# Patient Record
Sex: Female | Born: 1970 | Race: White | Hispanic: No | Marital: Single | Smoking: Current every day smoker
Health system: Southern US, Community
[De-identification: ages and names within clinical notes are randomized; demographics above are authoritative.]

## PROBLEM LIST (undated history)

## (undated) DIAGNOSIS — N200 Calculus of kidney: Secondary | ICD-10-CM

## (undated) DIAGNOSIS — G8929 Other chronic pain: Secondary | ICD-10-CM

## (undated) DIAGNOSIS — T4145XA Adverse effect of unspecified anesthetic, initial encounter: Secondary | ICD-10-CM

## (undated) DIAGNOSIS — T8859XA Other complications of anesthesia, initial encounter: Secondary | ICD-10-CM

## (undated) DIAGNOSIS — M25511 Pain in right shoulder: Secondary | ICD-10-CM

## (undated) HISTORY — PX: FRACTURE SURGERY: SHX138

## (undated) HISTORY — PX: ANKLE FRACTURE SURGERY: SHX122

## (undated) HISTORY — PX: TUBAL LIGATION: SHX77

---

## 2003-05-14 ENCOUNTER — Encounter: Payer: Self-pay | Admitting: Emergency Medicine

## 2003-05-14 ENCOUNTER — Emergency Department (HOSPITAL_COMMUNITY): Admission: EM | Admit: 2003-05-14 | Discharge: 2003-05-14 | Payer: Self-pay | Admitting: Emergency Medicine

## 2003-05-24 ENCOUNTER — Emergency Department (HOSPITAL_COMMUNITY): Admission: EM | Admit: 2003-05-24 | Discharge: 2003-05-24 | Payer: Self-pay | Admitting: Emergency Medicine

## 2003-05-26 ENCOUNTER — Emergency Department (HOSPITAL_COMMUNITY): Admission: EM | Admit: 2003-05-26 | Discharge: 2003-05-26 | Payer: Self-pay | Admitting: *Deleted

## 2003-05-26 ENCOUNTER — Encounter: Payer: Self-pay | Admitting: *Deleted

## 2003-08-28 ENCOUNTER — Encounter: Payer: Self-pay | Admitting: Emergency Medicine

## 2003-08-28 ENCOUNTER — Emergency Department (HOSPITAL_COMMUNITY): Admission: EM | Admit: 2003-08-28 | Discharge: 2003-08-28 | Payer: Self-pay | Admitting: Emergency Medicine

## 2005-09-28 ENCOUNTER — Emergency Department (HOSPITAL_COMMUNITY): Admission: EM | Admit: 2005-09-28 | Discharge: 2005-09-28 | Payer: Self-pay | Admitting: Emergency Medicine

## 2005-10-07 ENCOUNTER — Emergency Department (HOSPITAL_COMMUNITY): Admission: EM | Admit: 2005-10-07 | Discharge: 2005-10-07 | Payer: Self-pay | Admitting: Emergency Medicine

## 2006-08-06 ENCOUNTER — Emergency Department (HOSPITAL_COMMUNITY): Admission: EM | Admit: 2006-08-06 | Discharge: 2006-08-06 | Payer: Self-pay | Admitting: Emergency Medicine

## 2006-08-10 ENCOUNTER — Emergency Department (HOSPITAL_COMMUNITY): Admission: EM | Admit: 2006-08-10 | Discharge: 2006-08-10 | Payer: Self-pay | Admitting: Emergency Medicine

## 2006-08-12 ENCOUNTER — Emergency Department (HOSPITAL_COMMUNITY): Admission: EM | Admit: 2006-08-12 | Discharge: 2006-08-12 | Payer: Self-pay | Admitting: Emergency Medicine

## 2006-08-27 ENCOUNTER — Emergency Department (HOSPITAL_COMMUNITY): Admission: EM | Admit: 2006-08-27 | Discharge: 2006-08-27 | Payer: Self-pay | Admitting: Emergency Medicine

## 2007-05-03 ENCOUNTER — Emergency Department (HOSPITAL_COMMUNITY): Admission: EM | Admit: 2007-05-03 | Discharge: 2007-05-03 | Payer: Self-pay | Admitting: Emergency Medicine

## 2013-03-22 ENCOUNTER — Encounter (HOSPITAL_COMMUNITY): Payer: Self-pay | Admitting: *Deleted

## 2013-03-22 ENCOUNTER — Emergency Department (HOSPITAL_COMMUNITY)
Admission: EM | Admit: 2013-03-22 | Discharge: 2013-03-22 | Disposition: A | Discharge status: Home / Self Care | Payer: Self-pay | Attending: Emergency Medicine | Admitting: Emergency Medicine

## 2013-03-22 DIAGNOSIS — G8929 Other chronic pain: Secondary | ICD-10-CM | POA: Insufficient documentation

## 2013-03-22 DIAGNOSIS — M25511 Pain in right shoulder: Secondary | ICD-10-CM

## 2013-03-22 DIAGNOSIS — Z87828 Personal history of other (healed) physical injury and trauma: Secondary | ICD-10-CM | POA: Insufficient documentation

## 2013-03-22 DIAGNOSIS — M25519 Pain in unspecified shoulder: Secondary | ICD-10-CM | POA: Insufficient documentation

## 2013-03-22 DIAGNOSIS — F17200 Nicotine dependence, unspecified, uncomplicated: Secondary | ICD-10-CM | POA: Insufficient documentation

## 2013-03-22 MED ORDER — HYDROCODONE-ACETAMINOPHEN 5-325 MG PO TABS
1.0000 | ORAL_TABLET | Freq: Once | ORAL | Status: AC
  Administered 2013-03-22: 1 via ORAL
  Filled 2013-03-22: qty 1

## 2013-03-22 MED ORDER — CYCLOBENZAPRINE HCL 10 MG PO TABS: 10.0000 mg | ORAL_TABLET | Freq: Two times a day (BID) | ORAL | Status: DC | PRN

## 2013-03-22 MED ORDER — CYCLOBENZAPRINE HCL 10 MG PO TABS
10.0000 mg | ORAL_TABLET | Freq: Once | ORAL | Status: AC
  Administered 2013-03-22: 10 mg via ORAL
  Filled 2013-03-22: qty 1

## 2013-03-22 MED ORDER — IBUPROFEN 800 MG PO TABS
800.0000 mg | ORAL_TABLET | Freq: Once | ORAL | Status: AC
  Administered 2013-03-22: 800 mg via ORAL
  Filled 2013-03-22: qty 1

## 2013-03-22 MED ORDER — IBUPROFEN 800 MG PO TABS: 800.0000 mg | ORAL_TABLET | Freq: Three times a day (TID) | ORAL | Status: DC

## 2013-03-22 NOTE — ED Provider Notes (Signed)
History     CSN: [id number]  Arrival date & time 03/22/13  0112   First MD Initiated Contact with Patient 03/22/13 [phone]      Chief Complaint  Patient presents with  . Shoulder Pain    rt shoulder    (Consider location/radiation/quality/duration/timing/severity/associated sxs/prior treatment) HPI History provided by patient. Chronic right shoulder pain, has history of rotator cuff injury and bursitis but has never had surgery. Earlier today she tried to start a pull start lawnmower with exacerbation of pain. No weakness or numbness. No neck pain. No fevers. No other pain or trauma. Symptoms moderate severity. She is unable to sleep tonight due to pain. She has not taken anything at home for this. Has history of the same History reviewed. No pertinent past medical history.  Past Surgical History  Procedure Laterality Date  . Tubal ligation      History reviewed. No pertinent family history.  History  Substance Use Topics  . Smoking status: Heavy Tobacco Smoker  . Smokeless tobacco: Not on file  . Alcohol Use: Yes    OB History   Grav Para Term Preterm Abortions TAB SAB Ect Mult Living                  Review of Systems  Constitutional: Negative for fever and chills.  HENT: Negative for neck pain and neck stiffness.   Eyes: Negative for pain.  Respiratory: Negative for shortness of breath.   Cardiovascular: Negative for chest pain.  Gastrointestinal: Negative for abdominal pain.  Genitourinary: Negative for dysuria.  Musculoskeletal: Negative for back pain.  Skin: Negative for rash.  Neurological: Negative for headaches.  All other systems reviewed and are negative.    Allergies  Review of patient's allergies indicates no known allergies.  Home Medications  No current outpatient prescriptions on file.  BP 101/67  Pulse 88  Temp(Src) 98.1 F (36.7 C) (Oral)  Ht 5\' 7"  (1.702 m)  Wt 200 lb (90.719 kg)  BMI 31.32 kg/m2  SpO2 100%  LMP  02/20/2013  Physical Exam  Constitutional: She is oriented to person, place, and time. She appears well-developed and well-nourished.  HENT:  Head: Normocephalic and atraumatic.  Eyes: EOM are normal. Pupils are equal, round, and reactive to light.  Neck: Neck supple.  Cardiovascular: Regular rhythm and intact distal pulses.   Pulmonary/Chest: Effort normal. No respiratory distress.  Musculoskeletal: She exhibits no edema.  Right shoulder mild tenderness to palpation over the a.c. area without deformity. Reproducible pain with range of motion and testing against resistance. No swelling. No erythema. Distal neurovascular intact with no tenderness over clavicle, elbow or cervical spine  Neurological: She is alert and oriented to person, place, and time.  Skin: Skin is warm and dry.    ED Course  Procedures (including critical care time)  Right upper extremity sling. Motrin. Flexeril. Norco. Plan outpatient followup with orthopedic referral. Return precautions verbalized as understood  MDM  Right shoulder pain with known rotator cuff injury. No deformity or indication for x-rays at this time. Patient may benefit from MRI.   Sling And medications provided. Vital signs and nursing notes reviewed      Sunnie Nielsen, MD 03/22/13 [phone]

## 2013-03-22 NOTE — ED Notes (Signed)
Pt alert & oriented x4, stable gait. Patient given discharge instructions, paperwork & prescription(s). Patient  instructed to stop at the registration desk to finish any additional paperwork. Patient verbalized understanding. Pt left department w/ no further questions. 

## 2013-03-22 NOTE — ED Notes (Signed)
Pt has chronic pain in rt shoulder from rotator cuff injury, pt re injured shoulder on a pull start lawn mower.

## 2013-04-11 ENCOUNTER — Emergency Department (HOSPITAL_COMMUNITY)
Admission: EM | Admit: 2013-04-11 | Discharge: 2013-04-11 | Disposition: A | Discharge status: Home / Self Care | Payer: Self-pay | Attending: Emergency Medicine | Admitting: Emergency Medicine

## 2013-04-11 ENCOUNTER — Emergency Department (HOSPITAL_COMMUNITY): Payer: Self-pay

## 2013-04-11 ENCOUNTER — Encounter (HOSPITAL_COMMUNITY): Payer: Self-pay | Admitting: *Deleted

## 2013-04-11 DIAGNOSIS — F17200 Nicotine dependence, unspecified, uncomplicated: Secondary | ICD-10-CM | POA: Insufficient documentation

## 2013-04-11 DIAGNOSIS — Z9889 Other specified postprocedural states: Secondary | ICD-10-CM | POA: Insufficient documentation

## 2013-04-11 DIAGNOSIS — Y9301 Activity, walking, marching and hiking: Secondary | ICD-10-CM | POA: Insufficient documentation

## 2013-04-11 DIAGNOSIS — X500XXA Overexertion from strenuous movement or load, initial encounter: Secondary | ICD-10-CM | POA: Insufficient documentation

## 2013-04-11 DIAGNOSIS — Y9289 Other specified places as the place of occurrence of the external cause: Secondary | ICD-10-CM | POA: Insufficient documentation

## 2013-04-11 DIAGNOSIS — S93409A Sprain of unspecified ligament of unspecified ankle, initial encounter: Secondary | ICD-10-CM | POA: Insufficient documentation

## 2013-04-11 MED ORDER — HYDROCODONE-ACETAMINOPHEN 5-325 MG PO TABS
1.0000 | ORAL_TABLET | ORAL | Status: DC | PRN
Start: 2013-04-11 — End: 2013-04-19

## 2013-04-11 MED ORDER — HYDROCODONE-ACETAMINOPHEN 5-325 MG PO TABS
1.0000 | ORAL_TABLET | Freq: Once | ORAL | Status: AC
  Administered 2013-04-11: 1 via ORAL
  Filled 2013-04-11: qty 1

## 2013-04-11 NOTE — ED Notes (Signed)
Twisted lt ankle on Saturday, previous surgery for fracture 1991

## 2013-04-11 NOTE — ED Provider Notes (Signed)
History     CSN: [id number]  Arrival date & time 04/11/13  1129   First MD Initiated Contact with Patient 04/11/13 1222      Chief Complaint  Patient presents with  . Ankle Pain    (Consider location/radiation/quality/duration/timing/severity/associated sxs/prior treatment) HPI Comments: Elaine Morgan is a 42 y.o. Female presenting with left ankle pain after having an inversion injury 2 days ago while walking.  She has a previous left ankle fracture from over 20 years ago which required ORIF.  She is concerned for new injury at this site.  Her pain is constant, throbbing, and does not radiate.  She has tried elevation, rest and ibuprofen without relief of symptoms.  She denies numbness distal to the injury site.     The history is provided by the patient.    History reviewed. No pertinent past medical history.  Past Surgical History  Procedure Laterality Date  . Tubal ligation    . Fracture surgery    . Ankle fracture surgery      History reviewed. No pertinent family history.  History  Substance Use Topics  . Smoking status: Heavy Tobacco Smoker    Types: Cigarettes  . Smokeless tobacco: Not on file  . Alcohol Use: No    OB History   Grav Para Term Preterm Abortions TAB SAB Ect Mult Living                  Review of Systems  Musculoskeletal: Positive for joint swelling and arthralgias.  Skin: Negative for wound.  Neurological: Negative for weakness and numbness.    Allergies  Review of patient's allergies indicates no known allergies.  Home Medications   Current Outpatient Rx  Name  Route  Sig  Dispense  Refill  . ibuprofen (ADVIL,MOTRIN) 200 MG tablet   Oral   Take 400 mg by mouth every 6 (six) hours as needed for pain.         Marland Kitchen HYDROcodone-acetaminophen (NORCO/VICODIN) 5-325 MG per tablet   Oral   Take 1 tablet by mouth every 4 (four) hours as needed for pain.   15 tablet   0     BP 151/108  Pulse 72  Temp(Src) 97.6 F (36.4 C) (Oral)   Ht 5\' 7"  (1.702 m)  Wt 200 lb (90.719 kg)  BMI 31.32 kg/m2  SpO2 100%  LMP 03/23/2013  Physical Exam  Nursing note and vitals reviewed. Constitutional: She appears well-developed and well-nourished.  HENT:  Head: Normocephalic.  Neck: Normal range of motion.  Cardiovascular: Normal rate and intact distal pulses.  Exam reveals no decreased pulses.   Pulses:      Dorsalis pedis pulses are 2+ on the right side, and 2+ on the left side.       Posterior tibial pulses are 2+ on the right side, and 2+ on the left side.  Musculoskeletal: She exhibits edema and tenderness.       Left ankle: She exhibits swelling. She exhibits normal pulse. Tenderness. Medial malleolus tenderness found. No head of 5th metatarsal and no proximal fibula tenderness found. Achilles tendon normal.  Neurological: She is alert. No sensory deficit.  Skin: Skin is warm, dry and intact.    ED Course  Procedures (including critical care time)  Labs Reviewed - No data to display Dg Ankle Complete Left  04/11/2013  *RADIOLOGY REPORT*  Clinical Data: Ankle pain, twisting injury several days ago  LEFT ANKLE COMPLETE - 3+ VIEW  Comparison: None.  Findings: Mild  soft tissue swelling about the ankle.  No acute fracture or malalignment identified.  Surgical fixation of prior medial malleolus fracture with pain and cannulated lag screw without evidence of hardware complication.  A healed lateral malleolar fracture with a single inner fragmentary screw is also noted without evidence of complication.  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  The talar dome appears intact and normally mineralized.  Well corticated ossific fragments adjacent to the medial malleolus likely reflects sequelae of prior injury or surgery.  Ankle mortise is congruent.  IMPRESSION:  1.  No acute osseous abnormality. 2.  Surgical changes of ORIF of bimalleolar fracture without evidence of complication.   Original Report Authenticated By:  Malachy Moan, M.D.      1. Ankle sprain and strain, left, initial encounter       MDM  Patients labs and/or radiological studies were viewed and considered during the medical decision making and disposition process.  ASO and crutches provided.  Cap refill normal after ASO applied.  RICE, referral to ortho if pain symptoms and swelling are not better over the next 10days.            Burgess Amor, PA-C 04/12/13 [phone]

## 2013-04-12 NOTE — ED Provider Notes (Signed)
Medical screening examination/treatment/procedure(s) were performed by non-physician practitioner and as supervising physician I was immediately available for consultation/collaboration.   Dione Booze, MD 04/12/13 [phone]

## 2013-04-19 ENCOUNTER — Encounter (HOSPITAL_COMMUNITY): Payer: Self-pay

## 2013-04-19 ENCOUNTER — Emergency Department (HOSPITAL_COMMUNITY): Payer: Self-pay

## 2013-04-19 ENCOUNTER — Emergency Department (HOSPITAL_COMMUNITY)
Admission: EM | Admit: 2013-04-19 | Discharge: 2013-04-19 | Disposition: A | Discharge status: Home / Self Care | Payer: Self-pay | Attending: Emergency Medicine | Admitting: Emergency Medicine

## 2013-04-19 DIAGNOSIS — F17200 Nicotine dependence, unspecified, uncomplicated: Secondary | ICD-10-CM | POA: Insufficient documentation

## 2013-04-19 DIAGNOSIS — Z9851 Tubal ligation status: Secondary | ICD-10-CM | POA: Insufficient documentation

## 2013-04-19 DIAGNOSIS — R112 Nausea with vomiting, unspecified: Secondary | ICD-10-CM | POA: Insufficient documentation

## 2013-04-19 DIAGNOSIS — N201 Calculus of ureter: Secondary | ICD-10-CM | POA: Insufficient documentation

## 2013-04-19 DIAGNOSIS — Z3202 Encounter for pregnancy test, result negative: Secondary | ICD-10-CM | POA: Insufficient documentation

## 2013-04-19 LAB — CBC WITH DIFFERENTIAL/PLATELET
Basophils Absolute: 0 10*3/uL (ref 0.0–0.1)
Basophils Relative: 0 % (ref 0–1)
Eosinophils Absolute: 0.1 10*3/uL (ref 0.0–0.7)
Eosinophils Relative: 1 % (ref 0–5)
HCT: 41.5 % (ref 36.0–46.0)
Hemoglobin: 14.3 g/dL (ref 12.0–15.0)
Lymphocytes Relative: 20 % (ref 12–46)
MCHC: 34.5 g/dL (ref 30.0–36.0)
MCV: 94.5 fL (ref 78.0–100.0)
Monocytes Absolute: 0.7 10*3/uL (ref 0.1–1.0)
Monocytes Relative: 6 % (ref 3–12)
Neutro Abs: 8.2 10*3/uL — ABNORMAL HIGH (ref 1.7–7.7)
Platelets: 206 10*3/uL (ref 150–400)
RBC: 4.39 MIL/uL (ref 3.87–5.11)

## 2013-04-19 LAB — URINALYSIS, ROUTINE W REFLEX MICROSCOPIC
Glucose, UA: NEGATIVE mg/dL
Ketones, ur: NEGATIVE mg/dL
Protein, ur: 30 mg/dL — AB
Specific Gravity, Urine: 1.02 (ref 1.005–1.030)
Urobilinogen, UA: 0.2 mg/dL (ref 0.0–1.0)
pH: 7 (ref 5.0–8.0)

## 2013-04-19 LAB — COMPREHENSIVE METABOLIC PANEL
ALT: 9 U/L (ref 0–35)
AST: 17 U/L (ref 0–37)
Alkaline Phosphatase: 72 U/L (ref 39–117)
BUN: 12 mg/dL (ref 6–23)
CO2: 25 mEq/L (ref 19–32)
Calcium: 9.2 mg/dL (ref 8.4–10.5)
Chloride: 102 mEq/L (ref 96–112)
Creatinine, Ser: 0.99 mg/dL (ref 0.50–1.10)
GFR calc Af Amer: 80 mL/min — ABNORMAL LOW (ref >90)
GFR calc non Af Amer: 69 mL/min — ABNORMAL LOW (ref >90)
Glucose, Bld: 98 mg/dL (ref 70–99)
Potassium: 3.6 mEq/L (ref 3.5–5.1)
Sodium: 138 mEq/L (ref 135–145)
Total Bilirubin: 0.3 mg/dL (ref 0.3–1.2)
Total Protein: 7.4 g/dL (ref 6.0–8.3)

## 2013-04-19 LAB — URINE MICROSCOPIC-ADD ON

## 2013-04-19 LAB — PREGNANCY, URINE: Preg Test, Ur: NEGATIVE

## 2013-04-19 MED ORDER — ONDANSETRON HCL 4 MG/2ML IJ SOLN: 4.0000 mg | Freq: Once | INTRAMUSCULAR | Status: DC

## 2013-04-19 MED ORDER — HYDROMORPHONE HCL PF 1 MG/ML IJ SOLN
1.0000 mg | Freq: Once | INTRAMUSCULAR | Status: AC
  Administered 2013-04-19: 1 mg via INTRAVENOUS
  Filled 2013-04-19: qty 1

## 2013-04-19 MED ORDER — OXYCODONE-ACETAMINOPHEN 5-325 MG PO TABS
1.0000 | ORAL_TABLET | ORAL | Status: DC | PRN
Start: 2013-04-19 — End: 2013-05-19

## 2013-04-19 MED ORDER — IBUPROFEN 600 MG PO TABS: 600.0000 mg | ORAL_TABLET | Freq: Three times a day (TID) | ORAL | Status: DC | PRN

## 2013-04-19 MED ORDER — ONDANSETRON HCL 4 MG/2ML IJ SOLN
4.0000 mg | Freq: Once | INTRAMUSCULAR | Status: AC
  Administered 2013-04-19: 4 mg via INTRAVENOUS
  Filled 2013-04-19: qty 2

## 2013-04-19 MED ORDER — PROMETHAZINE HCL 25 MG PO TABS: 25.0000 mg | ORAL_TABLET | Freq: Four times a day (QID) | ORAL | Status: DC | PRN

## 2013-04-19 MED ORDER — KETOROLAC TROMETHAMINE 30 MG/ML IJ SOLN
30.0000 mg | Freq: Once | INTRAMUSCULAR | Status: AC
  Administered 2013-04-19: 30 mg via INTRAVENOUS
  Filled 2013-04-19: qty 1

## 2013-04-19 MED ORDER — TAMSULOSIN HCL 0.4 MG PO CAPS: 0.4000 mg | ORAL_CAPSULE | Freq: Every day | ORAL | Status: DC

## 2013-04-19 NOTE — ED Notes (Signed)
Per patient, she started having severe abdominal pain/cramping in her lower abd yesterday around 5pm. Since then she has vomited every 30 minutes. The pain radiates towards her left lower back. Denies pain during urination. Unable to keep food/drink down.

## 2013-04-19 NOTE — ED Notes (Signed)
Patient states that she started having lower abdominal cramping last night and it went away.  States that today, she woke up having lower abdominal pain with nausea and vomiting.  Rates her pain 10/10.  Reports that her period is supposed to start 04/22/2013, states that she did have a tubal ligation many years ago.

## 2013-04-19 NOTE — ED Provider Notes (Signed)
History    This chart was scribed for Lyanne Co, MD by Marlyne Beards, ED Scribe. The patient was seen in room APA03/[facility]03. Patient's care was started at 2:54 PM.    CSN: [id number]  Arrival date & time 04/19/13  1346   First MD Initiated Contact with Patient 04/19/13 1454      Chief Complaint  Patient presents with  . Abdominal Pain  . Emesis    (Consider location/radiation/quality/duration/timing/severity/associated sxs/prior treatment) HPI HPI Comments: Elaine Morgan is a 42 y.o. female who presents to the Emergency Department complaining of moderate constant lower abdominal pain with a cramping sensation which started earlier yesterday. Pt states the pain radiates from her left lower abdomen up into her back. She rates the pain a 10/10 as of now in the ED. Pt has associated nausea, vomiting, and is unable to keep food down. She reports she has an episode of emesis every 30 minutes. Pt denies any dysuria, fever, chills, cough, nausea, vomiting, diarrhea, SOB, weakness, and any other associated symptoms.   History reviewed. No pertinent past medical history.  Past Surgical History  Procedure Laterality Date  . Tubal ligation    . Fracture surgery    . Ankle fracture surgery      No family history on file.  History  Substance Use Topics  . Smoking status: Heavy Tobacco Smoker -- 1.00 packs/day    Types: Cigarettes  . Smokeless tobacco: Not on file  . Alcohol Use: No    OB History   Grav Para Term Preterm Abortions TAB SAB Ect Mult Living                  Review of Systems  Allergies  Review of patient's allergies indicates no known allergies.  Home Medications   Current Outpatient Rx  Name  Route  Sig  Dispense  Refill  . HYDROcodone-acetaminophen (NORCO/VICODIN) 5-325 MG per tablet   Oral   Take 1 tablet by mouth every 4 (four) hours as needed for pain.   15 tablet   0   . ibuprofen (ADVIL,MOTRIN) 200 MG tablet   Oral   Take 400 mg by mouth every 6  (six) hours as needed for pain.           BP 173/99  Pulse 58  Temp(Src) 97.2 F (36.2 C) (Oral)  Resp 16  Ht 5\' 7"  (1.702 m)  Wt 200 lb (90.719 kg)  BMI 31.32 kg/m2  SpO2 100%  LMP 03/23/2013  Physical Exam  Nursing note and vitals reviewed. Constitutional: She is oriented to person, place, and time. She appears well-developed and well-nourished. No distress.  HENT:  Head: Normocephalic and atraumatic.  Eyes: EOM are normal.  Neck: Normal range of motion.  Cardiovascular: Normal rate, regular rhythm and normal heart sounds.   Pulmonary/Chest: Effort normal and breath sounds normal.  Abdominal: Soft. She exhibits no distension. There is no tenderness. There is no rebound and no guarding.  No lower abdominal tenderness. No left CVA tenderness.  Musculoskeletal: Normal range of motion.  Neurological: She is alert and oriented to person, place, and time.  Skin: Skin is warm and dry.  Psychiatric: She has a normal mood and affect. Judgment normal.    ED Course  Procedures (including critical care time) DIAGNOSTIC STUDIES: Oxygen Saturation is 100% on room air, normal by my interpretation.    COORDINATION OF CARE: 3:09 PM Discussed ED treatment with pt and pt agrees.     Labs Reviewed  URINALYSIS, ROUTINE W REFLEX MICROSCOPIC - Abnormal; Notable for the following:    APPearance HAZY (*)    Hgb urine dipstick LARGE (*)    Protein, ur 30 (*)    Leukocytes, UA SMALL (*)    All other components within normal limits  COMPREHENSIVE METABOLIC PANEL - Abnormal; Notable for the following:    GFR calc non Af Amer 69 (*)    GFR calc Af Amer 80 (*)    All other components within normal limits  URINE MICROSCOPIC-ADD ON - Abnormal; Notable for the following:    Squamous Epithelial / LPF MANY (*)    Bacteria, UA FEW (*)    All other components within normal limits  PREGNANCY, URINE  CBC WITH DIFFERENTIAL   Ct Abdomen Pelvis Wo Contrast  04/19/2013  *RADIOLOGY REPORT*   Clinical Data: Left-sided flank and lower quadrant abdominal pain.  CT ABDOMEN AND PELVIS WITHOUT CONTRAST  Technique:  Multidetector CT imaging of the abdomen and pelvis was performed following the standard protocol without intravenous contrast.  Comparison: None.  Findings: There is evidence of moderate left hydronephrosis due to an obstructing calculus located at the ureteropelvic junction and on coronal reconstructions measuring 10 mm in height.  On axial images, the calculus measures 6 mm in transverse diameter.  No other calculi are identified in either kidney or ureter.  The other unenhanced solid organs are unremarkable including the liver, gallbladder, pancreas, spleen and adrenal glands.  No bowel obstruction or inflammation is seen.  No abnormal fluid collections are identified.  There is a small amount of free fluid dependently in the pelvis.  The uterus and adnexal regions are unremarkable. No hernias are identified. Bony structures show degenerative changes in the lumbar spine at the L5-S1 level.  IMPRESSION: Moderate left hydronephrosis secondary to an obstructing calculus at the UPJ measuring 10 mm in greatest dimensions.   Original Report Authenticated By: Irish Lack, M.D.    I personally reviewed the imaging tests through PACS system I reviewed available ER/hospitalization records through the EMR   1. Ureteral stone       MDM  Concerning for left ureteral stone  5:54 PM The patient is pain-free at this time.  Her vital signs are normal.  Urine shows no signs of infection.  She does have a large 10 mm x 6 mm left proximal ureteral stone.  She does have hydronephrosis with this.  Given the fact she is pain-free at think she is reasonable to follow up closely with urology.  He'll discuss the case with urology.  I do not think she'll need intervention tonight.  The patient understands that statistically she may have difficulty passing this size stone.  She understands to return to  the emergency department for new or worsening symptoms.  I specifically told her to return to the emergency department she develops worsening pain, fever, severe nausea vomiting at home.    I personally performed the services described in this documentation, which was scribed in my presence. The recorded information has been reviewed and is accurate.      Lyanne Co, MD 04/19/13 [phone]

## 2013-05-05 ENCOUNTER — Emergency Department (HOSPITAL_COMMUNITY)
Admission: EM | Admit: 2013-05-05 | Discharge: 2013-05-05 | Disposition: A | Discharge status: Home / Self Care | Payer: Self-pay | Attending: Emergency Medicine | Admitting: Emergency Medicine

## 2013-05-05 ENCOUNTER — Emergency Department (HOSPITAL_COMMUNITY): Payer: Self-pay

## 2013-05-05 ENCOUNTER — Encounter (HOSPITAL_COMMUNITY): Payer: Self-pay | Admitting: Emergency Medicine

## 2013-05-05 DIAGNOSIS — N201 Calculus of ureter: Secondary | ICD-10-CM | POA: Insufficient documentation

## 2013-05-05 DIAGNOSIS — N23 Unspecified renal colic: Secondary | ICD-10-CM | POA: Insufficient documentation

## 2013-05-05 DIAGNOSIS — Z87442 Personal history of urinary calculi: Secondary | ICD-10-CM | POA: Insufficient documentation

## 2013-05-05 DIAGNOSIS — R110 Nausea: Secondary | ICD-10-CM | POA: Insufficient documentation

## 2013-05-05 DIAGNOSIS — Z3202 Encounter for pregnancy test, result negative: Secondary | ICD-10-CM | POA: Insufficient documentation

## 2013-05-05 DIAGNOSIS — G8929 Other chronic pain: Secondary | ICD-10-CM | POA: Insufficient documentation

## 2013-05-05 DIAGNOSIS — M25519 Pain in unspecified shoulder: Secondary | ICD-10-CM | POA: Insufficient documentation

## 2013-05-05 DIAGNOSIS — F17200 Nicotine dependence, unspecified, uncomplicated: Secondary | ICD-10-CM | POA: Insufficient documentation

## 2013-05-05 HISTORY — DX: Pain in right shoulder: M25.511

## 2013-05-05 HISTORY — DX: Calculus of kidney: N20.0

## 2013-05-05 HISTORY — DX: Other chronic pain: G89.29

## 2013-05-05 LAB — URINALYSIS, ROUTINE W REFLEX MICROSCOPIC
Bilirubin Urine: NEGATIVE
Ketones, ur: NEGATIVE mg/dL
Specific Gravity, Urine: >1.03 — ABNORMAL HIGH (ref 1.005–1.030)
pH: 6 (ref 5.0–8.0)

## 2013-05-05 LAB — POCT I-STAT, CHEM 8
BUN: 5 mg/dL — ABNORMAL LOW (ref 6–23)
Chloride: 105 mEq/L (ref 96–112)
Creatinine, Ser: 1.1 mg/dL (ref 0.50–1.10)
Glucose, Bld: 90 mg/dL (ref 70–99)
Potassium: 3.7 mEq/L (ref 3.5–5.1)

## 2013-05-05 MED ORDER — NAPROXEN 250 MG PO TABS: 250.0000 mg | ORAL_TABLET | Freq: Two times a day (BID) | ORAL | Status: DC

## 2013-05-05 MED ORDER — ONDANSETRON HCL 4 MG PO TABS: 4.0000 mg | ORAL_TABLET | Freq: Three times a day (TID) | ORAL | Status: DC | PRN

## 2013-05-05 MED ORDER — HYDROMORPHONE HCL PF 1 MG/ML IJ SOLN
1.0000 mg | Freq: Once | INTRAMUSCULAR | Status: AC
  Administered 2013-05-05: 1 mg via INTRAMUSCULAR
  Filled 2013-05-05: qty 1

## 2013-05-05 MED ORDER — TAMSULOSIN HCL 0.4 MG PO CAPS: 0.4000 mg | ORAL_CAPSULE | Freq: Every day | ORAL | Status: DC

## 2013-05-05 MED ORDER — ONDANSETRON 8 MG PO TBDP
8.0000 mg | ORAL_TABLET | Freq: Once | ORAL | Status: AC
  Administered 2013-05-05: 8 mg via ORAL
  Filled 2013-05-05: qty 1

## 2013-05-05 MED ORDER — OXYCODONE-ACETAMINOPHEN 5-325 MG PO TABS: ORAL_TABLET | ORAL | Status: DC

## 2013-05-05 NOTE — ED Provider Notes (Signed)
History     CSN: [id number]  Arrival date & time 05/05/13  1428   First MD Initiated Contact with Patient 05/05/13 1438      Chief Complaint  Patient presents with  . Flank Pain     HPI Pt was seen at 1500.  Per pt, c/o sudden onset and persistence of waxing and waning left sided flank "pain" for the past 3 weeks, worse today. States she was eval in the ED 3 weeks ago for same, dx with a "4mm kidney stone." States she has not f/u with Uro MD as instructed. Pt describes the pain as "my kidney stone pain." Has been associated with nausea. Denies vaginal bleeding/discharge, no dysuria/hematuria, no abd pain, no vomiting/diarrhea, no black or blood in emesis, no CP/SOB, no fevers.     Past Medical History  Diagnosis Date  . Kidney stone   . Chronic right shoulder pain   . Kidney stone     Past Surgical History  Procedure Laterality Date  . Tubal ligation    . Fracture surgery    . Ankle fracture surgery        History  Substance Use Topics  . Smoking status: Heavy Tobacco Smoker -- 1.00 packs/day    Types: Cigarettes  . Smokeless tobacco: Not on file  . Alcohol Use: No      Review of Systems ROS: Statement: All systems negative except as marked or noted in the HPI; Constitutional: Negative for fever and chills. ; ; Eyes: Negative for eye pain, redness and discharge. ; ; ENMT: Negative for ear pain, hoarseness, nasal congestion, sinus pressure and sore throat. ; ; Cardiovascular: Negative for chest pain, palpitations, diaphoresis, dyspnea and peripheral edema. ; ; Respiratory: Negative for cough, wheezing and stridor. ; ; Gastrointestinal: +nausea. Negative for vomiting, diarrhea, abdominal pain, blood in stool, hematemesis, jaundice and rectal bleeding. . ; ; Genitourinary: +flank pain. Negative for dysuria and hematuria. ; ; Musculoskeletal: Negative for back pain and neck pain. Negative for swelling and trauma.; ; Skin: Negative for pruritus, rash, abrasions, blisters,  bruising and skin lesion.; ; Neuro: Negative for headache, lightheadedness and neck stiffness. Negative for weakness, altered level of consciousness , altered mental status, extremity weakness, paresthesias, involuntary movement, seizure and syncope.      Allergies  Review of patient's allergies indicates no known allergies.  Home Medications   Current Outpatient Rx  Name  Route  Sig  Dispense  Refill  . ibuprofen (ADVIL,MOTRIN) 200 MG tablet   Oral   Take 400 mg by mouth every 6 (six) hours as needed for pain.         Marland Kitchen ibuprofen (ADVIL,MOTRIN) 600 MG tablet   Oral   Take 1 tablet (600 mg total) by mouth every 8 (eight) hours as needed for pain.   15 tablet   0   . oxyCODONE-acetaminophen (PERCOCET/ROXICET) 5-325 MG per tablet   Oral   Take 1 tablet by mouth every 4 (four) hours as needed for pain.   30 tablet   0   . promethazine (PHENERGAN) 25 MG tablet   Oral   Take 1 tablet (25 mg total) by mouth every 6 (six) hours as needed for nausea.   12 tablet   0   . tamsulosin (FLOMAX) 0.4 MG CAPS   Oral   Take 1 capsule (0.4 mg total) by mouth daily.   10 capsule   0     Pulse 75  Temp(Src) 97.4 F (36.3 C) (Oral)  Resp 19  Ht 5\' 7"  (1.702 m)  Wt 200 lb (90.719 kg)  BMI 31.32 kg/m2  SpO2 100%  LMP 04/30/2013  Physical Exam 1505: Physical examination:  Nursing notes reviewed; Vital signs and O2 SAT reviewed;  Constitutional: Well developed, Well nourished, Well hydrated, Uncomfortable appearing; Head:  Normocephalic, atraumatic; Eyes: EOMI, PERRL, No scleral icterus; ENMT: Mouth and pharynx normal, Mucous membranes moist; Neck: Supple, Full range of motion, No lymphadenopathy; Cardiovascular: Regular rate and rhythm, No murmur, rub, or gallop; Respiratory: Breath sounds clear & equal bilaterally, No rales, rhonchi, wheezes.  Speaking full sentences with ease, Normal respiratory effort/excursion; Chest: Nontender, Movement normal; Abdomen: Soft, Nontender,  Nondistended, Normal bowel sounds; Genitourinary: No CVA tenderness.; Spine:  No midline CS, TS, LS tenderness.;; Extremities: Pulses normal, No tenderness, No edema, No calf edema or asymmetry.; Neuro: AA&Ox3, Major CN grossly intact.  Speech clear. No gross focal motor or sensory deficits in extremities.; Skin: Color normal, Warm, Dry.   ED Course  Procedures     MDM  MDM Reviewed: previous chart, vitals and nursing note Reviewed previous: labs and CT scan Interpretation: CT scan and labs   Results for orders placed during the hospital encounter of 05/05/13  URINALYSIS, ROUTINE W REFLEX MICROSCOPIC      Result Value Range   Color, Urine YELLOW  YELLOW   APPearance HAZY (*) CLEAR   Specific Gravity, Urine >1.030 (*) 1.005 - 1.030   pH 6.0  5.0 - 8.0   Glucose, UA NEGATIVE  NEGATIVE mg/dL   Hgb urine dipstick LARGE (*) NEGATIVE   Bilirubin Urine NEGATIVE  NEGATIVE   Ketones, ur NEGATIVE  NEGATIVE mg/dL   Protein, ur 30 (*) NEGATIVE mg/dL   Urobilinogen, UA 0.2  0.0 - 1.0 mg/dL   Nitrite NEGATIVE  NEGATIVE   Leukocytes, UA NEGATIVE  NEGATIVE  PREGNANCY, URINE      Result Value Range   Preg Test, Ur NEGATIVE  NEGATIVE  URINE MICROSCOPIC-ADD ON      Result Value Range   Squamous Epithelial / LPF MANY (*) RARE   WBC, UA 0-2  <3 WBC/hpf   RBC / HPF TOO NUMEROUS TO COUNT  <3 RBC/hpf   Bacteria, UA FEW (*) RARE  POCT I-STAT, CHEM 8      Result Value Range   Sodium 141  135 - 145 mEq/L   Potassium 3.7  3.5 - 5.1 mEq/L   Chloride 105  96 - 112 mEq/L   BUN 5 (*) 6 - 23 mg/dL   Creatinine, Ser 1.61  0.50 - 1.10 mg/dL   Glucose, Bld 90  70 - 99 mg/dL   Calcium, Ion 0.96  0.45 - 1.23 mmol/L   TCO2 25  0 - 100 mmol/L   Hemoglobin 13.9  12.0 - 15.0 g/dL   HCT 40.9  81.1 - 91.4 %   Ct Abdomen Pelvis Wo Contrast 05/05/2013   *RADIOLOGY REPORT*  Clinical Data: Left flank pain  CT ABDOMEN AND PELVIS WITHOUT CONTRAST  Technique:  Multidetector CT imaging of the abdomen and pelvis was  performed following the standard protocol without intravenous contrast.  Comparison: 04/19/2013  Findings: The 10 mm left ureteral pelvic junction calculus is stable.  Left hydronephrosis is stable.  Left perinephric stranding has slightly worsened.  Tiny calculus in the lower pole of the right kidney.  No right hydronephrosis.  Free fluid in the pelvis has resolved.  Otherwise stable exam.  IMPRESSION: Stable position of the 10 mm left ureteral pelvic junction  calculus.  Left hydronephrosis is stable.  Left perinephric stranding has slightly increased.  These findings are consistent with a persistent element of obstruction.   Original Report Authenticated By: Jolaine Click, M.D.      [phone]:  Pt states she "feels better now" and wants to go home. Ambulatory with steady gait, easy resps. Tol PO without N/V. BUN/Cr per baseline. No UTI on Udip.  Dx and testing d/w pt.  Questions answered.  Verb understanding, agreeable to d/c home with outpt f/u with Uro MD.        Laray Anger, DO 05/08/13 [phone]

## 2013-05-05 NOTE — ED Notes (Signed)
Patient with no complaints at this time. Respirations even and unlabored. Skin warm/dry. Discharge instructions reviewed with patient at this time. Patient given opportunity to voice concerns/ask questions. Patient discharged at this time and left Emergency Department with steady gait.   

## 2013-05-05 NOTE — ED Notes (Signed)
Intermittent left side flank pain x 3 weeks. Has known kidney stone. nad at this time. Feels the urge to urinate right after she goes.

## 2013-05-06 LAB — URINE CULTURE: Colony Count: 85000

## 2013-05-19 ENCOUNTER — Encounter (HOSPITAL_COMMUNITY): Payer: Self-pay | Admitting: *Deleted

## 2013-05-19 ENCOUNTER — Emergency Department (HOSPITAL_COMMUNITY)
Admission: EM | Admit: 2013-05-19 | Discharge: 2013-05-19 | Discharge status: Against Medical Advice | Payer: Self-pay | Attending: Emergency Medicine | Admitting: Emergency Medicine

## 2013-05-19 DIAGNOSIS — F17200 Nicotine dependence, unspecified, uncomplicated: Secondary | ICD-10-CM | POA: Insufficient documentation

## 2013-05-19 DIAGNOSIS — R109 Unspecified abdominal pain: Secondary | ICD-10-CM | POA: Insufficient documentation

## 2013-05-19 NOTE — ED Notes (Signed)
Called for pt to room, no pt found in waiting room

## 2013-05-19 NOTE — ED Notes (Signed)
Pt with left flank pain with pain worse today, denies blood in urine, denies hx of kidney stones, + nausea, denies vomiting or diarrhea

## 2013-05-23 ENCOUNTER — Encounter (HOSPITAL_COMMUNITY): Payer: Self-pay | Admitting: *Deleted

## 2013-05-23 ENCOUNTER — Emergency Department (HOSPITAL_COMMUNITY)
Admission: EM | Admit: 2013-05-23 | Discharge: 2013-05-23 | Discharge status: Against Medical Advice | Payer: Self-pay | Attending: Emergency Medicine | Admitting: Emergency Medicine

## 2013-05-23 ENCOUNTER — Emergency Department (HOSPITAL_COMMUNITY): Payer: Self-pay

## 2013-05-23 ENCOUNTER — Emergency Department (HOSPITAL_COMMUNITY)
Admission: EM | Admit: 2013-05-23 | Discharge: 2013-05-23 | Disposition: A | Discharge status: Home / Self Care | Payer: Self-pay | Attending: Emergency Medicine | Admitting: Emergency Medicine

## 2013-05-23 DIAGNOSIS — Z87442 Personal history of urinary calculi: Secondary | ICD-10-CM | POA: Insufficient documentation

## 2013-05-23 DIAGNOSIS — F17200 Nicotine dependence, unspecified, uncomplicated: Secondary | ICD-10-CM | POA: Insufficient documentation

## 2013-05-23 DIAGNOSIS — N200 Calculus of kidney: Secondary | ICD-10-CM | POA: Insufficient documentation

## 2013-05-23 DIAGNOSIS — Z3202 Encounter for pregnancy test, result negative: Secondary | ICD-10-CM | POA: Insufficient documentation

## 2013-05-23 DIAGNOSIS — R109 Unspecified abdominal pain: Secondary | ICD-10-CM | POA: Insufficient documentation

## 2013-05-23 DIAGNOSIS — G8929 Other chronic pain: Secondary | ICD-10-CM | POA: Insufficient documentation

## 2013-05-23 DIAGNOSIS — M549 Dorsalgia, unspecified: Secondary | ICD-10-CM | POA: Insufficient documentation

## 2013-05-23 DIAGNOSIS — R319 Hematuria, unspecified: Secondary | ICD-10-CM | POA: Insufficient documentation

## 2013-05-23 DIAGNOSIS — R1110 Vomiting, unspecified: Secondary | ICD-10-CM | POA: Insufficient documentation

## 2013-05-23 LAB — BASIC METABOLIC PANEL
CO2: 25 mEq/L (ref 19–32)
Calcium: 9.1 mg/dL (ref 8.4–10.5)
Glucose, Bld: 92 mg/dL (ref 70–99)
Potassium: 3.6 mEq/L (ref 3.5–5.1)
Sodium: 140 mEq/L (ref 135–145)

## 2013-05-23 LAB — URINALYSIS, ROUTINE W REFLEX MICROSCOPIC
Bilirubin Urine: NEGATIVE
Glucose, UA: NEGATIVE mg/dL
Nitrite: NEGATIVE
Specific Gravity, Urine: >1.03 — ABNORMAL HIGH (ref 1.005–1.030)
pH: 6 (ref 5.0–8.0)

## 2013-05-23 LAB — CBC WITH DIFFERENTIAL/PLATELET
Eosinophils Relative: 2 % (ref 0–5)
HCT: 41.3 % (ref 36.0–46.0)
Hemoglobin: 14.5 g/dL (ref 12.0–15.0)
Lymphocytes Relative: 31 % (ref 12–46)
Lymphs Abs: 2.2 10*3/uL (ref 0.7–4.0)
MCV: 94.3 fL (ref 78.0–100.0)
Platelets: 196 10*3/uL (ref 150–400)
RBC: 4.38 MIL/uL (ref 3.87–5.11)
WBC: 7.2 10*3/uL (ref 4.0–10.5)

## 2013-05-23 LAB — URINE MICROSCOPIC-ADD ON

## 2013-05-23 MED ORDER — SODIUM CHLORIDE 0.9 % IV SOLN: INTRAVENOUS | Status: DC

## 2013-05-23 MED ORDER — OXYCODONE-ACETAMINOPHEN 5-325 MG PO TABS: 1.0000 | ORAL_TABLET | Freq: Four times a day (QID) | ORAL | Status: DC | PRN

## 2013-05-23 MED ORDER — HYDROMORPHONE HCL PF 1 MG/ML IJ SOLN
1.0000 mg | Freq: Once | INTRAMUSCULAR | Status: AC
  Administered 2013-05-23: 1 mg via INTRAVENOUS
  Filled 2013-05-23: qty 1

## 2013-05-23 MED ORDER — SODIUM CHLORIDE 0.9 % IV BOLUS (SEPSIS)
250.0000 mL | Freq: Once | INTRAVENOUS | Status: AC
  Administered 2013-05-23: 250 mL via INTRAVENOUS

## 2013-05-23 MED ORDER — ONDANSETRON HCL 4 MG/2ML IJ SOLN
4.0000 mg | Freq: Once | INTRAMUSCULAR | Status: AC
  Administered 2013-05-23: 4 mg via INTRAVENOUS
  Filled 2013-05-23: qty 2

## 2013-05-23 NOTE — ED Provider Notes (Signed)
History     CSN: [id number]  Arrival date & time 05/23/13  1759   First MD Initiated Contact with Patient 05/23/13 1835      Chief Complaint  Patient presents with  . Nephrolithiasis    (Consider location/radiation/quality/duration/timing/severity/associated sxs/prior treatment) The history is provided by the patient.   the 42 year old female with history of left kidney stone in the past. Patient was seen May 29 CT confirmed 10 mm stone left side at the UVJ, patient was referred to the urologist but was not able to followup. Patient to her current intermittent pain over the last 3- days. Denies any fever chills dysuria or blood in the urine does have some nausea and some vomiting. Patient without history of kidney stones in the past. Symptoms originally started about a month ago. Pain is described as 10 out of 10. Sharp in nature. Not made worse by anything.   Past Medical History  Diagnosis Date  . Kidney stone   . Chronic right shoulder pain   . Kidney stone     Past Surgical History  Procedure Laterality Date  . Tubal ligation    . Fracture surgery    . Ankle fracture surgery      No family history on file.  History  Substance Use Topics  . Smoking status: Heavy Tobacco Smoker -- 1.00 packs/day    Types: Cigarettes  . Smokeless tobacco: Not on file  . Alcohol Use: No    OB History   Grav Para Term Preterm Abortions TAB SAB Ect Mult Living                  Review of Systems  Constitutional: Negative for fever.  HENT: Negative for congestion.   Respiratory: Negative for shortness of breath.   Cardiovascular: Negative for chest pain.  Gastrointestinal: Positive for nausea and vomiting.  Genitourinary: Positive for flank pain. Negative for dysuria and hematuria.  Musculoskeletal: Positive for back pain.  Skin: Negative for rash.  Neurological: Negative for headaches.  Hematological: Does not bruise/bleed easily.  Psychiatric/Behavioral: Negative for  confusion.    Allergies  Hydrocodone  Home Medications   Current Outpatient Rx  Name  Route  Sig  Dispense  Refill  . naproxen (NAPROSYN) 250 MG tablet   Oral   Take 1 tablet (250 mg total) by mouth 2 (two) times daily with a meal.   14 tablet   0   . naproxen sodium (ANAPROX) 220 MG tablet   Oral   Take 440 mg by mouth daily as needed (for pain).         Marland Kitchen acetaminophen (TYLENOL) 500 MG tablet   Oral   Take 1,000 mg by mouth every 6 (six) hours as needed for pain.         Marland Kitchen oxyCODONE-acetaminophen (PERCOCET/ROXICET) 5-325 MG per tablet   Oral   Take 1-2 tablets by mouth every 6 (six) hours as needed for pain.   15 tablet   0     BP 156/115  Pulse 82  Temp(Src) 98.2 F (36.8 C) (Oral)  Resp 19  Ht 5\' 7"  (1.702 m)  Wt 195 lb (88.451 kg)  BMI 30.53 kg/m2  SpO2 100%  LMP 04/30/2013  Physical Exam  Nursing note and vitals reviewed. Constitutional: She is oriented to person, place, and time. She appears well-developed and well-nourished.  HENT:  Head: Normocephalic and atraumatic.  Mouth/Throat: Oropharynx is clear and moist.  Eyes: Conjunctivae and EOM are normal. Pupils are equal, round,  and reactive to light.  Neck: Normal range of motion. Neck supple.  Cardiovascular: Normal rate, regular rhythm and normal heart sounds.   No murmur heard. Pulmonary/Chest: Effort normal and breath sounds normal. No respiratory distress.  Abdominal: Soft. Bowel sounds are normal. There is no tenderness.  Musculoskeletal: Normal range of motion.  Neurological: She is alert and oriented to person, place, and time. No cranial nerve deficit. She exhibits normal muscle tone. Coordination normal.  Skin: Skin is warm. No rash noted.    ED Course  Procedures (including critical care time)  Labs Reviewed  URINALYSIS, ROUTINE W REFLEX MICROSCOPIC - Abnormal; Notable for the following:    APPearance CLOUDY (*)    Specific Gravity, Urine >1.030 (*)    Hgb urine dipstick LARGE  (*)    Protein, ur 30 (*)    All other components within normal limits  BASIC METABOLIC PANEL - Abnormal; Notable for the following:    GFR calc non Af Amer 83 (*)    All other components within normal limits  URINE MICROSCOPIC-ADD ON - Abnormal; Notable for the following:    Squamous Epithelial / LPF FEW (*)    Bacteria, UA FEW (*)    All other components within normal limits  PREGNANCY, URINE  CBC WITH DIFFERENTIAL   Ct Abdomen Pelvis Wo Contrast  05/23/2013   *RADIOLOGY REPORT*  Clinical Data: Left-sided flank pain for 1 month, history of renal stones  CT ABDOMEN AND PELVIS WITHOUT CONTRAST  Technique:  Multidetector CT imaging of the abdomen and pelvis was performed following the standard protocol without intravenous contrast.  Comparison: CT abdomen pelvis - 05/05/2013; 04/19/2013  Findings:  The lack of intravenous contrast limits the ability to evaluate solid abdominal organs.  The approximately 0.9 x 0.6 cm stone previously located at the level of the left UPJ is now located within the left renal pelvis with associated interval decrease in previously associated asymmetric left-sided pelvocaliectasis.  No additional renal stones.  No renal stones are seen along the expected course of either ureter or the urinary bladder.  Normal appearance of the urinary bladder given underdistension.  Multiple phleboliths are seen within the lower pelvis.  There is a linear calcification within the proximal aspect of the left internal iliac artery.  Normal hepatic contour.  Normal noncontrast appearance of the gallbladder.  No ascites.  Normal noncontrast appearance of the bilateral adrenal glands, pancreas and spleen.  Scattered minimal colonic diverticulosis without evidence of diverticulitis.  The bowel is otherwise normal in course and caliber without wall thickening or evidence of obstruction.  The appendix is not definitely identified on this noncontrast examination, however there is no inflammatory  change within the right lower abdominal quadrant.  No pneumoperitoneum, pneumatosis or portal venous gas.  Normal caliber of the abdominal aorta.  Scattered minimal atherosclerotic plaque in the distal aspect of the abdominal aorta. No definite retroperitoneal, mesenteric, pelvic or inguinal lymphadenopathy on this noncontrast examination.  Normal noncontrast appearance of the pelvic organs.  No discrete adnexal lesion.  No free fluid in the pelvis.  Limited visualization of the lower thorax demonstrates minimal gross asymmetric ground-glass atelectasis.  No focal airspace opacities.  No pleural effusion.  Normal heart size.  No pericardial effusion.  No acute or aggressive osseous abnormalities.  Moderate DDD at L5 - S1 with disc space height loss, end plate irregularity and sclerosis.  IMPRESSION: 1.  Interval cranial migration of previously noted left-sided UVJ stone now located within the left renal pelvis.  This finding is associated with interval decrease in previously noted left-sided pelvocaliectasis.  No additional renal stones identified. 2.  Unchanged moderate DDD of L5 - S1.   Original Report Authenticated By: Tacey Ruiz, MD   Results for orders placed during the hospital encounter of 05/23/13  URINALYSIS, ROUTINE W REFLEX MICROSCOPIC      Result Value Range   Color, Urine YELLOW  YELLOW   APPearance CLOUDY (*) CLEAR   Specific Gravity, Urine >1.030 (*) 1.005 - 1.030   pH 6.0  5.0 - 8.0   Glucose, UA NEGATIVE  NEGATIVE mg/dL   Hgb urine dipstick LARGE (*) NEGATIVE   Bilirubin Urine NEGATIVE  NEGATIVE   Ketones, ur NEGATIVE  NEGATIVE mg/dL   Protein, ur 30 (*) NEGATIVE mg/dL   Urobilinogen, UA 0.2  0.0 - 1.0 mg/dL   Nitrite NEGATIVE  NEGATIVE   Leukocytes, UA NEGATIVE  NEGATIVE  PREGNANCY, URINE      Result Value Range   Preg Test, Ur NEGATIVE  NEGATIVE  CBC WITH DIFFERENTIAL      Result Value Range   WBC 7.2  4.0 - 10.5 K/uL   RBC 4.38  3.87 - 5.11 MIL/uL   Hemoglobin 14.5  12.0  - 15.0 g/dL   HCT 16.1  09.6 - 04.5 %   MCV 94.3  78.0 - 100.0 fL   MCH 33.1  26.0 - 34.0 pg   MCHC 35.1  30.0 - 36.0 g/dL   RDW 40.9  81.1 - 91.4 %   Platelets 196  150 - 400 K/uL   Neutrophils Relative % 60  43 - 77 %   Neutro Abs 4.3  1.7 - 7.7 K/uL   Lymphocytes Relative 31  12 - 46 %   Lymphs Abs 2.2  0.7 - 4.0 K/uL   Monocytes Relative 7  3 - 12 %   Monocytes Absolute 0.5  0.1 - 1.0 K/uL   Eosinophils Relative 2  0 - 5 %   Eosinophils Absolute 0.2  0.0 - 0.7 K/uL   Basophils Relative 0  0 - 1 %   Basophils Absolute 0.0  0.0 - 0.1 K/uL  BASIC METABOLIC PANEL      Result Value Range   Sodium 140  135 - 145 mEq/L   Potassium 3.6  3.5 - 5.1 mEq/L   Chloride 105  96 - 112 mEq/L   CO2 25  19 - 32 mEq/L   Glucose, Bld 92  70 - 99 mg/dL   BUN 16  6 - 23 mg/dL   Creatinine, Ser 7.82  0.50 - 1.10 mg/dL   Calcium 9.1  8.4 - 95.6 mg/dL   GFR calc non Af Amer 83 (*) >90 mL/min   GFR calc Af Amer >90  >90 mL/min  URINE MICROSCOPIC-ADD ON      Result Value Range   Squamous Epithelial / LPF FEW (*) RARE   WBC, UA 0-2  <3 WBC/hpf   RBC / HPF TOO NUMEROUS TO COUNT  <3 RBC/hpf   Bacteria, UA FEW (*) RARE     1. Kidney stone   2. Hematuria       MDM  Radiology report as above. Have not heard officially or personally of a kidney stone moving retrograde. However they believe that the previous 10 mm stone at the ureteral vesicular junction is now up in the renal pelvis. Patient still has hematuria no evidence of infection no evidence of renal insufficiency. We'll treat with pain medicine and  followup with urology.        Shelda Jakes, MD 05/23/13 2053

## 2013-05-23 NOTE — ED Notes (Signed)
Intermittent left flank pain since last visit a month ago. Nausea. Pt was here earlier but left after being triaged due to the long wait. NAD.

## 2013-05-23 NOTE — ED Notes (Signed)
Pt states pain from kidney stone since last visit ~ a month ago. Bilateral flank pain today.

## 2013-10-05 ENCOUNTER — Encounter (HOSPITAL_COMMUNITY): Payer: Self-pay | Admitting: Emergency Medicine

## 2013-10-05 ENCOUNTER — Emergency Department (HOSPITAL_COMMUNITY): Payer: No Typology Code available for payment source

## 2013-10-05 ENCOUNTER — Inpatient Hospital Stay (HOSPITAL_COMMUNITY)
Admission: EM | Admit: 2013-10-05 | Discharge: 2013-10-09 | DRG: 131 | Disposition: A | Discharge status: Home / Self Care | Payer: No Typology Code available for payment source | Attending: General Surgery | Admitting: General Surgery

## 2013-10-05 DIAGNOSIS — F1210 Cannabis abuse, uncomplicated: Secondary | ICD-10-CM | POA: Diagnosis present

## 2013-10-05 DIAGNOSIS — S02609B Fracture of mandible, unspecified, initial encounter for open fracture: Secondary | ICD-10-CM

## 2013-10-05 DIAGNOSIS — K047 Periapical abscess without sinus: Secondary | ICD-10-CM | POA: Diagnosis present

## 2013-10-05 DIAGNOSIS — F411 Generalized anxiety disorder: Secondary | ICD-10-CM | POA: Diagnosis present

## 2013-10-05 DIAGNOSIS — S025XXA Fracture of tooth (traumatic), initial encounter for closed fracture: Secondary | ICD-10-CM | POA: Diagnosis present

## 2013-10-05 DIAGNOSIS — S0266XB Fracture of symphysis of mandible, initial encounter for open fracture: Principal | ICD-10-CM | POA: Diagnosis present

## 2013-10-05 DIAGNOSIS — S02609A Fracture of mandible, unspecified, initial encounter for closed fracture: Secondary | ICD-10-CM

## 2013-10-05 DIAGNOSIS — F17200 Nicotine dependence, unspecified, uncomplicated: Secondary | ICD-10-CM | POA: Diagnosis present

## 2013-10-05 DIAGNOSIS — S02401A Maxillary fracture, unspecified, initial encounter for closed fracture: Secondary | ICD-10-CM | POA: Diagnosis present

## 2013-10-05 DIAGNOSIS — S02109A Fracture of base of skull, unspecified side, initial encounter for closed fracture: Secondary | ICD-10-CM | POA: Diagnosis present

## 2013-10-05 DIAGNOSIS — S02400A Malar fracture unspecified, initial encounter for closed fracture: Secondary | ICD-10-CM | POA: Diagnosis present

## 2013-10-05 DIAGNOSIS — T7421XA Adult sexual abuse, confirmed, initial encounter: Secondary | ICD-10-CM

## 2013-10-05 HISTORY — DX: Other complications of anesthesia, initial encounter: T88.59XA

## 2013-10-05 HISTORY — DX: Adverse effect of unspecified anesthetic, initial encounter: T41.45XA

## 2013-10-05 LAB — CBC WITH DIFFERENTIAL/PLATELET
Basophils Relative: 0 % (ref 0–1)
Eosinophils Absolute: 0.1 10*3/uL (ref 0.0–0.7)
HCT: 39.2 % (ref 36.0–46.0)
Hemoglobin: 13.8 g/dL (ref 12.0–15.0)
Lymphocytes Relative: 16 % (ref 12–46)
Lymphs Abs: 2 10*3/uL (ref 0.7–4.0)
MCH: 34.2 pg — ABNORMAL HIGH (ref 26.0–34.0)
MCHC: 35.2 g/dL (ref 30.0–36.0)
Monocytes Absolute: 0.9 10*3/uL (ref 0.1–1.0)
Monocytes Relative: 7 % (ref 3–12)
Neutrophils Relative %: 76 % (ref 43–77)
RBC: 4.03 MIL/uL (ref 3.87–5.11)
RDW: 12.1 % (ref 11.5–15.5)

## 2013-10-05 LAB — BASIC METABOLIC PANEL
BUN: 14 mg/dL (ref 6–23)
CO2: 27 mEq/L (ref 19–32)
Chloride: 102 mEq/L (ref 96–112)
GFR calc non Af Amer: 61 mL/min — ABNORMAL LOW (ref >90)
Glucose, Bld: 117 mg/dL — ABNORMAL HIGH (ref 70–99)
Potassium: 3.7 mEq/L (ref 3.5–5.1)
Sodium: 139 mEq/L (ref 135–145)

## 2013-10-05 LAB — ETHANOL: Alcohol, Ethyl (B): <11 mg/dL (ref 0–11)

## 2013-10-05 MED ORDER — CEFAZOLIN SODIUM-DEXTROSE 2-3 GM-% IV SOLR
2.0000 g | Freq: Three times a day (TID) | INTRAVENOUS | Status: DC
  Administered 2013-10-05 – 2013-10-09 (×11): 2 g via INTRAVENOUS
  Filled 2013-10-05 (×14): qty 50

## 2013-10-05 MED ORDER — ONDANSETRON HCL 4 MG PO TABS: 4.0000 mg | ORAL_TABLET | Freq: Four times a day (QID) | ORAL | Status: DC | PRN

## 2013-10-05 MED ORDER — AZITHROMYCIN 1 G PO PACK
1.0000 g | PACK | Freq: Once | ORAL | Status: AC
  Administered 2013-10-06: 1 g via ORAL
  Filled 2013-10-05: qty 1

## 2013-10-05 MED ORDER — PANTOPRAZOLE SODIUM 40 MG IV SOLR
40.0000 mg | Freq: Every day | INTRAVENOUS | Status: DC
  Administered 2013-10-07 – 2013-10-08 (×2): 40 mg via INTRAVENOUS
  Filled 2013-10-05 (×4): qty 40

## 2013-10-05 MED ORDER — LEVONORGESTREL 0.75 MG PO TABS
1.5000 mg | ORAL_TABLET | Freq: Once | ORAL | Status: AC
  Administered 2013-10-06: 1.5 mg via ORAL
  Filled 2013-10-05: qty 2

## 2013-10-05 MED ORDER — HYDROMORPHONE HCL PF 1 MG/ML IJ SOLN
1.0000 mg | INTRAMUSCULAR | Status: DC | PRN
  Administered 2013-10-06 – 2013-10-07 (×11): 1 mg via INTRAVENOUS
  Administered 2013-10-07: 0.5 mg via INTRAVENOUS
  Administered 2013-10-07 (×2): 1 mg via INTRAVENOUS
  Administered 2013-10-07: 0.5 mg via INTRAVENOUS
  Administered 2013-10-07 – 2013-10-09 (×10): 1 mg via INTRAVENOUS
  Filled 2013-10-05 (×24): qty 1

## 2013-10-05 MED ORDER — HYDROMORPHONE HCL PF 1 MG/ML IJ SOLN
1.0000 mg | Freq: Once | INTRAMUSCULAR | Status: AC
  Administered 2013-10-05: 1 mg via INTRAVENOUS

## 2013-10-05 MED ORDER — ONDANSETRON HCL 4 MG/2ML IJ SOLN
4.0000 mg | Freq: Once | INTRAMUSCULAR | Status: AC
  Administered 2013-10-05: 4 mg via INTRAVENOUS
  Filled 2013-10-05: qty 2

## 2013-10-05 MED ORDER — ONDANSETRON HCL 4 MG/2ML IJ SOLN
4.0000 mg | Freq: Four times a day (QID) | INTRAMUSCULAR | Status: DC | PRN
  Administered 2013-10-06: 4 mg via INTRAVENOUS
  Filled 2013-10-05: qty 2

## 2013-10-05 MED ORDER — SODIUM CHLORIDE 0.9 % IV SOLN
INTRAVENOUS | Status: DC
  Administered 2013-10-05: 23:00:00 via INTRAVENOUS

## 2013-10-05 MED ORDER — HYDROMORPHONE HCL PF 1 MG/ML IJ SOLN
INTRAMUSCULAR | Status: AC
  Administered 2013-10-05: 1 mg via INTRAVENOUS
  Filled 2013-10-05: qty 1

## 2013-10-05 MED ORDER — HYDROMORPHONE HCL PF 1 MG/ML IJ SOLN
1.0000 mg | Freq: Once | INTRAMUSCULAR | Status: AC
  Administered 2013-10-05: 1 mg via INTRAVENOUS
  Filled 2013-10-05: qty 1

## 2013-10-05 MED ORDER — TETANUS-DIPHTH-ACELL PERTUSSIS 5-2.5-18.5 LF-MCG/0.5 IM SUSP
0.5000 mL | Freq: Once | INTRAMUSCULAR | Status: AC
  Administered 2013-10-05: 0.5 mL via INTRAMUSCULAR
  Filled 2013-10-05: qty 0.5

## 2013-10-05 MED ORDER — ONDANSETRON HCL 4 MG/2ML IJ SOLN
4.0000 mg | Freq: Once | INTRAMUSCULAR | Status: AC
  Administered 2013-10-05: 4 mg via INTRAMUSCULAR

## 2013-10-05 MED ORDER — PIPERACILLIN-TAZOBACTAM 3.375 G IVPB 30 MIN
3.3750 g | Freq: Once | INTRAVENOUS | Status: AC
  Administered 2013-10-05: 3.375 g via INTRAVENOUS
  Filled 2013-10-05 (×2): qty 50

## 2013-10-05 MED ORDER — LORAZEPAM 2 MG/ML IJ SOLN: 1.0000 mg | Freq: Four times a day (QID) | INTRAMUSCULAR | Status: DC | PRN

## 2013-10-05 MED ORDER — LORAZEPAM 2 MG/ML IJ SOLN
1.0000 mg | Freq: Once | INTRAMUSCULAR | Status: AC
  Administered 2013-10-05: 1 mg via INTRAVENOUS
  Filled 2013-10-05: qty 1

## 2013-10-05 MED ORDER — PANTOPRAZOLE SODIUM 40 MG PO TBEC
40.0000 mg | DELAYED_RELEASE_TABLET | Freq: Every day | ORAL | Status: DC
  Administered 2013-10-06 – 2013-10-09 (×2): 40 mg via ORAL
  Filled 2013-10-05 (×2): qty 1

## 2013-10-05 MED ORDER — ONDANSETRON HCL 4 MG/2ML IJ SOLN
INTRAMUSCULAR | Status: AC
  Administered 2013-10-05: 4 mg via INTRAMUSCULAR
  Filled 2013-10-05: qty 2

## 2013-10-05 MED ORDER — METRONIDAZOLE 500 MG PO TABS: 2000.0000 mg | ORAL_TABLET | Freq: Once | ORAL | Status: DC

## 2013-10-05 MED ORDER — SODIUM CHLORIDE 0.9 % IV SOLN
Freq: Once | INTRAVENOUS | Status: AC
  Administered 2013-10-05: 20:00:00 via INTRAVENOUS

## 2013-10-05 MED ORDER — ACETAMINOPHEN 500 MG PO TABS
1000.0000 mg | ORAL_TABLET | Freq: Once | ORAL | Status: AC
  Administered 2013-10-05: 1000 mg via ORAL
  Filled 2013-10-05: qty 2

## 2013-10-05 MED ORDER — CEFIXIME 400 MG PO TABS
400.0000 mg | ORAL_TABLET | Freq: Once | ORAL | Status: AC
  Administered 2013-10-06: 400 mg via ORAL
  Filled 2013-10-05: qty 1

## 2013-10-05 MED ORDER — HYDROMORPHONE HCL PF 1 MG/ML IJ SOLN
1.0000 mg | Freq: Once | INTRAMUSCULAR | Status: AC
  Administered 2013-10-05 (×2): 1 mg via INTRAVENOUS
  Filled 2013-10-05: qty 1

## 2013-10-05 MED ORDER — CHLORHEXIDINE GLUCONATE 0.12 % MT SOLN
15.0000 mL | Freq: Four times a day (QID) | OROMUCOSAL | Status: DC
  Administered 2013-10-06 – 2013-10-09 (×7): 15 mL via OROMUCOSAL
  Filled 2013-10-05 (×9): qty 15

## 2013-10-05 MED ORDER — IBUPROFEN 800 MG PO TABS
800.0000 mg | ORAL_TABLET | Freq: Once | ORAL | Status: AC
  Administered 2013-10-05: 800 mg via ORAL
  Filled 2013-10-05: qty 1

## 2013-10-05 MED ORDER — SODIUM CHLORIDE 0.9 % IV BOLUS (SEPSIS)
1000.0000 mL | Freq: Once | INTRAVENOUS | Status: AC
  Administered 2013-10-05: 1000 mL via INTRAVENOUS
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
## 2013-10-05 NOTE — ED Notes (Signed)
[facility] leaving with patient 

## 2013-10-05 NOTE — ED Notes (Signed)
Pt mom [phone]

## 2013-10-05 NOTE — ED Notes (Signed)
Pt back from CT crying that her mouth is still aching after 1 mg Dilaudid. Dr. Adriana Simas aware and new order received.
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
## 2013-10-05 NOTE — ED Notes (Signed)
[facility] on transport.
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
## 2013-10-05 NOTE — ED Notes (Signed)
Pt crying , in severe jaw pain, pt wanting to see mother, MD aware and ok with mother coming back to the room.  MD discussed plan with pt. Pt reminds in clothes, has not voided. Will wait for transport to see sane nurse. MD gave verbal orders.

## 2013-10-05 NOTE — ED Notes (Signed)
Pt arrived by carelink to [facility]. SANE RN notified.

## 2013-10-06 ENCOUNTER — Encounter (HOSPITAL_COMMUNITY): Payer: Self-pay | Admitting: General Practice

## 2013-10-06 LAB — CBC
HCT: 34.5 % — ABNORMAL LOW (ref 36.0–46.0)
Hemoglobin: 11.7 g/dL — ABNORMAL LOW (ref 12.0–15.0)
MCV: 98.6 fL (ref 78.0–100.0)
RBC: 3.5 MIL/uL — ABNORMAL LOW (ref 3.87–5.11)
RDW: 12.4 % (ref 11.5–15.5)
WBC: 13.9 10*3/uL — ABNORMAL HIGH (ref 4.0–10.5)

## 2013-10-06 LAB — RAPID URINE DRUG SCREEN, HOSP PERFORMED
Amphetamines: NOT DETECTED
Barbiturates: POSITIVE — AB
Cocaine: POSITIVE — AB
Opiates: POSITIVE — AB
Tetrahydrocannabinol: POSITIVE — AB

## 2013-10-06 LAB — URINALYSIS, ROUTINE W REFLEX MICROSCOPIC
Glucose, UA: NEGATIVE mg/dL
Hgb urine dipstick: NEGATIVE
Ketones, ur: NEGATIVE mg/dL
Leukocytes, UA: NEGATIVE
Nitrite: NEGATIVE
Protein, ur: NEGATIVE mg/dL
Specific Gravity, Urine: 1.026 (ref 1.005–1.030)
Urobilinogen, UA: 0.2 mg/dL (ref 0.0–1.0)
pH: 5.5 (ref 5.0–8.0)

## 2013-10-06 LAB — BASIC METABOLIC PANEL
CO2: 23 mEq/L (ref 19–32)
Chloride: 108 mEq/L (ref 96–112)
Creatinine, Ser: 1 mg/dL (ref 0.50–1.10)
GFR calc Af Amer: 79 mL/min — ABNORMAL LOW (ref >90)
GFR calc non Af Amer: 68 mL/min — ABNORMAL LOW (ref >90)
Potassium: 3.6 mEq/L (ref 3.5–5.1)

## 2013-10-06 LAB — POCT PREGNANCY, URINE: Preg Test, Ur: NEGATIVE

## 2013-10-06 LAB — SURGICAL PCR SCREEN: Staphylococcus aureus: POSITIVE — AB

## 2013-10-06 MED ORDER — OXYCODONE HCL 5 MG/5ML PO SOLN
5.0000 mg | ORAL | Status: DC | PRN
  Administered 2013-10-06 – 2013-10-08 (×6): 10 mg via ORAL
  Filled 2013-10-06 (×6): qty 10

## 2013-10-06 MED ORDER — MUPIROCIN 2 % EX OINT
TOPICAL_OINTMENT | Freq: Two times a day (BID) | CUTANEOUS | Status: DC
  Administered 2013-10-07 – 2013-10-09 (×6): via NASAL
  Filled 2013-10-06 (×2): qty 22

## 2013-10-06 MED ORDER — AZITHROMYCIN 1 G PO PACK
1.0000 g | PACK | Freq: Once | ORAL | Status: AC
  Administered 2013-10-06: 1 g via ORAL
  Filled 2013-10-06: qty 1

## 2013-10-06 MED ORDER — CHLORHEXIDINE GLUCONATE CLOTH 2 % EX PADS
6.0000 | MEDICATED_PAD | Freq: Every day | CUTANEOUS | Status: DC
  Administered 2013-10-07: 6 via TOPICAL

## 2013-10-06 MED ORDER — ALPRAZOLAM 0.25 MG PO TABS
0.2500 mg | ORAL_TABLET | Freq: Three times a day (TID) | ORAL | Status: DC | PRN
  Administered 2013-10-06 – 2013-10-09 (×8): 0.25 mg via ORAL
  Filled 2013-10-06 (×8): qty 1

## 2013-10-06 MED ORDER — CEFIXIME 400 MG PO TABS
400.0000 mg | ORAL_TABLET | Freq: Once | ORAL | Status: AC
  Administered 2013-10-06: 400 mg via ORAL
  Filled 2013-10-06 (×2): qty 1

## 2013-10-06 MED ORDER — SODIUM CHLORIDE 0.9 % IV SOLN
INTRAVENOUS | Status: DC
  Administered 2013-10-07: 04:00:00 via INTRAVENOUS
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
## 2013-10-06 NOTE — Progress Notes (Signed)
UR completed.  Whittley Carandang, RN BSN [facility] CCM Trauma/Neuro ICU Case Manager [phone]  
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
## 2013-10-06 NOTE — Progress Notes (Signed)
CSW attempted to meet with pt to complete psychosocial assessment and SBIRT screening, however pt requested CSW see her later due to her pain level.  Pt groaning in [pain, waiting on pain meds, c/o a "dry socket."  CSW will attempt to see again 10/31.

## 2013-10-06 NOTE — Progress Notes (Signed)
SANE RN has evaluated patient. Negative pregnancy in the urine per SANE RN
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
## 2013-10-07 ENCOUNTER — Encounter (HOSPITAL_COMMUNITY): Admission: EM | Disposition: A | Discharge status: Home / Self Care | Payer: Self-pay | Source: Home / Self Care

## 2013-10-07 ENCOUNTER — Inpatient Hospital Stay (HOSPITAL_COMMUNITY): Payer: No Typology Code available for payment source | Admitting: Anesthesiology

## 2013-10-07 ENCOUNTER — Encounter (HOSPITAL_COMMUNITY): Payer: No Typology Code available for payment source | Admitting: Anesthesiology

## 2013-10-07 HISTORY — PX: ORIF MANDIBULAR FRACTURE: SHX2127

## 2013-10-07 SURGERY — OPEN REDUCTION INTERNAL FIXATION (ORIF) MANDIBULAR FRACTURE
Anesthesia: General | Site: Mouth | Laterality: Bilateral | Wound class: Clean Contaminated

## 2013-10-07 MED ORDER — HYDROMORPHONE HCL PF 1 MG/ML IJ SOLN
INTRAMUSCULAR | Status: AC
  Administered 2013-10-07: 0.5 mg via INTRAVENOUS
  Filled 2013-10-07: qty 1

## 2013-10-07 MED ORDER — ARTIFICIAL TEARS OP OINT
TOPICAL_OINTMENT | OPHTHALMIC | Status: DC | PRN
  Administered 2013-10-07: 1 via OPHTHALMIC

## 2013-10-07 MED ORDER — MIDAZOLAM HCL 5 MG/5ML IJ SOLN
INTRAMUSCULAR | Status: DC | PRN
  Administered 2013-10-07: 2 mg via INTRAVENOUS

## 2013-10-07 MED ORDER — LIDOCAINE-EPINEPHRINE (PF) 1 %-1:200000 IJ SOLN
INTRAMUSCULAR | Status: DC | PRN
  Administered 2013-10-07: 30 mL

## 2013-10-07 MED ORDER — PROMETHAZINE HCL 25 MG/ML IJ SOLN: 12.5000 mg | Freq: Four times a day (QID) | INTRAMUSCULAR | Status: DC | PRN

## 2013-10-07 MED ORDER — 0.9 % SODIUM CHLORIDE (POUR BTL) OPTIME
TOPICAL | Status: DC | PRN
  Administered 2013-10-07: 1000 mL

## 2013-10-07 MED ORDER — FENTANYL CITRATE 0.05 MG/ML IJ SOLN: 25.0000 ug | INTRAMUSCULAR | Status: DC | PRN

## 2013-10-07 MED ORDER — SUCCINYLCHOLINE CHLORIDE 20 MG/ML IJ SOLN
INTRAMUSCULAR | Status: DC | PRN
  Administered 2013-10-07: 100 mg via INTRAVENOUS

## 2013-10-07 MED ORDER — KCL IN DEXTROSE-NACL 20-5-0.45 MEQ/L-%-% IV SOLN
INTRAVENOUS | Status: DC
  Administered 2013-10-07 – 2013-10-08 (×2): via INTRAVENOUS
  Filled 2013-10-07 (×2): qty 1000

## 2013-10-07 MED ORDER — OXYMETAZOLINE HCL 0.05 % NA SOLN
NASAL | Status: DC | PRN
  Administered 2013-10-07: 2 via NASAL

## 2013-10-07 MED ORDER — OXYMETAZOLINE HCL 0.05 % NA SOLN
1.0000 | Freq: Two times a day (BID) | NASAL | Status: DC
  Filled 2013-10-07: qty 15

## 2013-10-07 MED ORDER — ONDANSETRON HCL 4 MG/2ML IJ SOLN
INTRAMUSCULAR | Status: DC | PRN
  Administered 2013-10-07 (×2): 4 mg via INTRAVENOUS

## 2013-10-07 MED ORDER — LACTATED RINGERS IV SOLN
INTRAVENOUS | Status: DC | PRN
  Administered 2013-10-07 (×2): via INTRAVENOUS

## 2013-10-07 MED ORDER — NEOSTIGMINE METHYLSULFATE 1 MG/ML IJ SOLN
INTRAMUSCULAR | Status: DC | PRN
  Administered 2013-10-07: 5 mg via INTRAVENOUS

## 2013-10-07 MED ORDER — ROCURONIUM BROMIDE 100 MG/10ML IV SOLN
INTRAVENOUS | Status: DC | PRN
  Administered 2013-10-07 (×2): 20 mg via INTRAVENOUS
  Administered 2013-10-07: 50 mg via INTRAVENOUS

## 2013-10-07 MED ORDER — GLYCOPYRROLATE 0.2 MG/ML IJ SOLN
INTRAMUSCULAR | Status: DC | PRN
  Administered 2013-10-07: .8 mg via INTRAVENOUS

## 2013-10-07 MED ORDER — FENTANYL CITRATE 0.05 MG/ML IJ SOLN
INTRAMUSCULAR | Status: DC | PRN
  Administered 2013-10-07: 250 ug via INTRAVENOUS
  Administered 2013-10-07: 50 ug via INTRAVENOUS

## 2013-10-07 MED ORDER — LIDOCAINE-EPINEPHRINE (PF) 1 %-1:200000 IJ SOLN
INTRAMUSCULAR | Status: AC
  Filled 2013-10-07: qty 10

## 2013-10-07 MED ORDER — PROPOFOL 10 MG/ML IV BOLUS
INTRAVENOUS | Status: DC | PRN
  Administered 2013-10-07: 200 mg via INTRAVENOUS

## 2013-10-07 MED ORDER — LIDOCAINE HCL (CARDIAC) 20 MG/ML IV SOLN
INTRAVENOUS | Status: DC | PRN
  Administered 2013-10-07: 80 mg via INTRAVENOUS

## 2013-10-07 SURGICAL SUPPLY — 63 items
BIT DRILL 1.5X50 7MMSTP W/NTCH (MISCELLANEOUS) ×1 IMPLANT
BIT DRILL STOP 1.8X50X26 (MISCELLANEOUS) ×1 IMPLANT
BLADE SURG 15 STRL LF DISP TIS (BLADE) ×1 IMPLANT
BLADE SURG 15 STRL SS (BLADE) ×1
BLADE SURG ROTATE 9660 (MISCELLANEOUS) IMPLANT
CANISTER SUCTION 2500CC (MISCELLANEOUS) ×2 IMPLANT
CLEANER TIP ELECTROSURG 2X2 (MISCELLANEOUS) ×2 IMPLANT
CLOTH BEACON ORANGE TIMEOUT ST (SAFETY) ×2 IMPLANT
COVER SURGICAL LIGHT HANDLE (MISCELLANEOUS) ×2 IMPLANT
DECANTER SPIKE VIAL GLASS SM (MISCELLANEOUS) ×2 IMPLANT
DRILL 1.5X50 7MM STP W/NOTCH (MISCELLANEOUS) ×2
DRILL BIT 1.5X50MMX7MM STOP (BLUE BAND) FOR 2.0 MM SCREWS ×2 IMPLANT
DRILL STOP 1.8X50X26 (MISCELLANEOUS) ×2
ELECT COATED BLADE 2.86 ST (ELECTRODE) ×2 IMPLANT
ELECT REM PT RETURN 9FT ADLT (ELECTROSURGICAL) ×2
ELECTRODE REM PT RTRN 9FT ADLT (ELECTROSURGICAL) ×1 IMPLANT
GLOVE BIO SURGEON STRL SZ 6.5 (GLOVE) ×2 IMPLANT
GLOVE BIOGEL M 6.5 STRL (GLOVE) ×6 IMPLANT
GLOVE BIOGEL PI IND STRL 6.5 (GLOVE) ×3 IMPLANT
GLOVE BIOGEL PI IND STRL 7.0 (GLOVE) ×1 IMPLANT
GLOVE BIOGEL PI INDICATOR 6.5 (GLOVE) ×3
GLOVE BIOGEL PI INDICATOR 7.0 (GLOVE) ×1
GLOVE SURG SS PI 6.5 STRL IVOR (GLOVE) ×2 IMPLANT
GLOVE SURG SS PI 7.0 STRL IVOR (GLOVE) ×2 IMPLANT
GOWN STRL NON-REIN LRG LVL3 (GOWN DISPOSABLE) ×4 IMPLANT
KIT BASIN OR (CUSTOM PROCEDURE TRAY) ×2 IMPLANT
KIT ROOM TURNOVER OR (KITS) ×2 IMPLANT
NEEDLE 27GAX1X1/2 (NEEDLE) ×4 IMPLANT
NS IRRIG 1000ML POUR BTL (IV SOLUTION) ×2 IMPLANT
PAD ARMBOARD 7.5X6 YLW CONV (MISCELLANEOUS) ×4 IMPLANT
PENCIL BUTTON HOLSTER BLD 10FT (ELECTRODE) ×2 IMPLANT
PLATE 7H STRAIGHT 1.0MM SILVER (Plate) ×2 IMPLANT
PLATE LOCK 7H 1.6 GOLD GRD 3 (Plate) ×2 IMPLANT
PLATE LOCK SML ANGLE 1.6 GOLD (Plate) ×2 IMPLANT
SCISSORS WIRE ANG 4 3/4 DISP (INSTRUMENTS) ×2 IMPLANT
SCREW LOCKING X-DR 2.3X10 (Screw) ×8 IMPLANT
SCREW LOCKING X-DR 2.3X12 (Screw) ×6 IMPLANT
SCREW LOCKING X-DR 2.3X8 (Screw) ×2 IMPLANT
SCREW NON LOCK X-DR 2.0X6 (Screw) ×6 IMPLANT
SCREW NON LOCK X-DR 2.3X6 (Screw) ×2 IMPLANT
SCREW NON LOCK X-DR 2.3X8 (Screw) ×6 IMPLANT
SCREW SD IMF HEX 2.0X9 (Screw) ×8 IMPLANT
SCREW X-DR EMERG 2.7X8 (Screw) ×2 IMPLANT
SUT ETHILON 4 0 CL P 3 (SUTURE) IMPLANT
SUT MON AB 3-0 SH 27 (SUTURE) ×2
SUT MON AB 3-0 SH27 (SUTURE) ×2 IMPLANT
SUT PLAIN 5 0 P 3 18 (SUTURE) ×2 IMPLANT
SUT PROLENE 6 0 PC 1 (SUTURE) IMPLANT
SUT STEEL 0 (SUTURE)
SUT STEEL 0 18XMFL TIE 17 (SUTURE) IMPLANT
SUT STEEL 1 (SUTURE) ×2 IMPLANT
SUT STEEL 2 (SUTURE) IMPLANT
SUT STEEL 4 (SUTURE) ×2 IMPLANT
SUT VIC AB 4-0 RB1 27 (SUTURE) ×2
SUT VIC AB 4-0 RB1 27X BRD (SUTURE) ×2 IMPLANT
SUT VIC AB 4-0 SH 27 (SUTURE) ×2
SUT VIC AB 4-0 SH 27XBRD (SUTURE) ×2 IMPLANT
SUT VICRYL 4-0 PS2 18IN ABS (SUTURE) IMPLANT
TOWEL OR 17X24 6PK STRL BLUE (TOWEL DISPOSABLE) ×6 IMPLANT
TOWEL OR 17X26 10 PK STRL BLUE (TOWEL DISPOSABLE) ×2 IMPLANT
TRAY ENT MC OR (CUSTOM PROCEDURE TRAY) ×2 IMPLANT
TRAY FOLEY CATH 14FRSI W/METER (CATHETERS) ×2 IMPLANT
WATER STERILE IRR 1000ML POUR (IV SOLUTION) ×2 IMPLANT

## 2013-10-07 NOTE — Preoperative (Signed)
Beta Blockers   Reason not to administer Beta Blockers:Not Applicable 

## 2013-10-07 NOTE — Anesthesia Procedure Notes (Addendum)
Performed by: Campbell Lerner M    Procedure Name: Intubation Date/Time: 10/07/2013 3:29 PM Performed by: Ellin Goodie Pre-anesthesia Checklist: Patient identified, Emergency Drugs available, Suction available, Patient being monitored and Timeout performed Patient Re-evaluated:Patient Re-evaluated prior to inductionOxygen Delivery Method: Circle system utilized Preoxygenation: Pre-oxygenation with 100% oxygen Intubation Type: IV induction Ventilation: Mask ventilation without difficulty Laryngoscope Size: Mac and 3 Grade View: Grade I Nasal Tubes: Nasal Rae, Magill forceps- large, utilized and Nasal prep performed Endobronchial tube: Right Tube size: 6.0 mm Number of attempts: 2 Placement Confirmation: ETT inserted through vocal cords under direct vision,  positive ETCO2 and breath sounds checked- equal and bilateral Tube secured with: Tape Dental Injury: Teeth and Oropharynx as per pre-operative assessment  Comments: Easy induction.  Attempted to pass #7 nasal rae in both nares unsuccessfully.  Downsized to #6 nasal rae with easy passage into right side.  Magill forceps used to pass ETT through cords.  Dr. Randa Evens verified placement of ETT.  Carlynn Herald, CRNA

## 2013-10-07 NOTE — Progress Notes (Signed)
Patient awaiting surgery.  This patient has been seen and I agree with the findings and treatment plan.  Marta Lamas. Gae Bon, MD, FACS [phone] (pager) [phone] (direct pager) Trauma Surgeon
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
## 2013-10-07 NOTE — Transfer of Care (Signed)
Immediate Anesthesia Transfer of Care Note  Patient: Elaine Morgan  Procedure(s) Performed: Procedure(s) with comments: BILATERAL ORIF MANDIBULAR FRACTURE INTERMAXILLARY FIXATION/EXTRACTION OF TOOTH (Bilateral) - ANESTHESIA: NASO TRACHEAL INTUBATION  Patient Location: PACU  Anesthesia Type:General  Level of Consciousness: awake, alert  and oriented  Airway & Oxygen Therapy: Patient connected to face mask oxygen  Post-op Assessment: Report given to PACU RN  Post vital signs: stable  Complications: No apparent anesthesia complications

## 2013-10-07 NOTE — Brief Op Note (Signed)
10/05/2013 - 10/07/2013  6:55 PM  PATIENT:  Elaine Morgan XXXPruitt  42 y.o. female  PRE-OPERATIVE DIAGNOSIS:  OPENED MANDIBULAR FRACTURE MULTIPLE SIGHT  POST-OPERATIVE DIAGNOSIS:  OPENED MANDIBULAR FRACTURE MULTIPLE SIGHT  PROCEDURE:  Procedure(s) with comments: BILATERAL ORIF MANDIBULAR FRACTURE INTERMAXILLARY FIXATION/EXTRACTION OF TOOTH (Bilateral) - ANESTHESIA: NASO TRACHEAL INTUBATION  SURGEON:  Surgeon(s) and Role:    * Glenna Fellows, MD - Primary    ASSISTANTS: S. Rayburn PA-C*   ANESTHESIA:   general  EBL:  Total I/O In: 1300 [I.V.:1300] Out: 400 [Urine:250; Blood:150]  BLOOD ADMINISTERED:none  DRAINS: none   LOCAL MEDICATIONS USED:  LIDOCAINE   SPECIMEN:  No Specimen and Source of Specimen:  tooth 31 (fractured through root), tooth 32 (abscessed premorbid)  DISPOSITION OF SPECIMEN:  PATHOLOGY  COUNTS:  YES  TOURNIQUET:  * No tourniquets in log *  DICTATION: .Note written in EPIC  PLAN OF CARE: Admit to inpatient   PATIENT DISPOSITION:  PACU - hemodynamically stable.   Delay start of Pharmacological VTE agent (>24hrs) due to surgical blood loss or risk of bleeding: not applicable

## 2013-10-07 NOTE — Interval H&P Note (Signed)
History and Physical Interval Note:  10/07/2013 10:04 AM  Elaine Morgan  has presented today for surgery, with the diagnosis of OPENED MANDIBULAR FRACTURE MULTIPLE SIGHT  The various methods of treatment have been discussed with the patient and family. After consideration of risks, benefits and other options for treatment, the patient has consented to  Procedure(s) with comments: BILATERAL ORIF MANDIBULAR FRACTURE INTERMAXILLARY FIXATION/POSSIBLE EXTRACTIONS OF TEETH (Bilateral) - ANESTHESIA: NASO TRACHEAL INTUBATION as a surgical intervention .  The patient's history has been reviewed, patient examined, no change in status, stable for surgery.  I have reviewed the patient's chart and labs.  Questions were answered to the patient's satisfaction.     Kiam Bransfield

## 2013-10-07 NOTE — Anesthesia Postprocedure Evaluation (Signed)
  Anesthesia Post-op Note  Patient: Elaine Morgan  Procedure(s) Performed: Procedure(s) with comments: BILATERAL ORIF MANDIBULAR FRACTURE INTERMAXILLARY FIXATION/EXTRACTION OF TOOTH (Bilateral) - ANESTHESIA: NASO TRACHEAL INTUBATION  Patient Location: PACU  Anesthesia Type:General  Level of Consciousness: awake and alert   Airway and Oxygen Therapy: Patient Spontanous Breathing  Post-op Pain: severe  Post-op Assessment: Post-op Vital signs reviewed, Patient's Cardiovascular Status Stable and Respiratory Function Stable  Post-op Vital Signs: Reviewed  Filed Vitals:   10/07/13 2000  BP: 153/95  Pulse: 85  Temp:   Resp: 15    Complications: No apparent anesthesia complications

## 2013-10-07 NOTE — Anesthesia Preprocedure Evaluation (Addendum)
Anesthesia Evaluation  Patient identified by MRN, date of birth, ID band Patient awake    Reviewed: Allergy & Precautions, H&P , NPO status , Patient's Chart, lab work & pertinent test results, reviewed documented beta blocker date and time   History of Anesthesia Complications History of anesthetic complications: hard time breathing after surgery per pt.   Airway Mallampati: II TM Distance: >3 FB     Dental  (+) Dental Advisory Given and Missing,    Pulmonary Current Smoker,  breath sounds clear to auscultation        Cardiovascular negative cardio ROS  Rhythm:Regular Rate:Normal     Neuro/Psych Anxiety    GI/Hepatic negative GI ROS, Neg liver ROS,   Endo/Other  negative endocrine ROS  Renal/GU Renal InsufficiencyRenal disease  negative genitourinary   Musculoskeletal negative musculoskeletal ROS (+)   Abdominal (+)  Abdomen: soft. Bowel sounds: normal.  Peds negative pediatric ROS (+)  Hematology negative hematology ROS (+)   Anesthesia Other Findings   Reproductive/Obstetrics negative OB ROS                      Anesthesia Physical Anesthesia Plan  ASA: III  Anesthesia Plan: General   Post-op Pain Management:    Induction: Intravenous  Airway Management Planned: Oral ETT  Additional Equipment:   Intra-op Plan:   Post-operative Plan: Extubation in OR  Informed Consent: I have reviewed the patients History and Physical, chart, labs and discussed the procedure including the risks, benefits and alternatives for the proposed anesthesia with the patient or authorized representative who has indicated his/her understanding and acceptance.   Dental advisory given  Plan Discussed with: CRNA and Anesthesiologist  Anesthesia Plan Comments:         Anesthesia Quick Evaluation

## 2013-10-08 DIAGNOSIS — F411 Generalized anxiety disorder: Secondary | ICD-10-CM

## 2013-10-08 MED ORDER — CLINDAMYCIN HCL 300 MG PO CAPS: 300.0000 mg | ORAL_CAPSULE | Freq: Three times a day (TID) | ORAL | Status: DC

## 2013-10-08 MED ORDER — OXYCODONE HCL 5 MG PO TABS
5.0000 mg | ORAL_TABLET | ORAL | Status: DC | PRN
  Administered 2013-10-08 – 2013-10-09 (×5): 5 mg via ORAL
  Filled 2013-10-08 (×5): qty 1

## 2013-10-08 MED ORDER — OXYCODONE-ACETAMINOPHEN 10-325 MG PO TABS: 1.0000 | ORAL_TABLET | ORAL | Status: DC | PRN

## 2013-10-08 MED ORDER — OXYCODONE-ACETAMINOPHEN 5-325 MG PO TABS
1.0000 | ORAL_TABLET | ORAL | Status: DC | PRN
  Administered 2013-10-08 – 2013-10-09 (×4): 1 via ORAL
  Filled 2013-10-08 (×5): qty 1
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
## 2013-10-08 NOTE — Progress Notes (Signed)
Pt states unable to obtain monies for the Rx for percocet and cleocin, Care manager called and notified.

## 2013-10-08 NOTE — Progress Notes (Signed)
Pt crying out in pain.  States she does not feel her pain will be controlled at home on the po pain meds.

## 2013-10-08 NOTE — Progress Notes (Signed)
   CARE MANAGEMENT NOTE 10/08/2013  Patient:  Elaine Morgan, Elaine Morgan   Account Number:  [id number]  Date Initiated:  10/08/2013  Documentation initiated by:  [facility]  Subjective/Objective Assessment:   adm: Open fracture of mandible, multiple sites; fractured tooth 31     Action/Plan:   discharge planning   Anticipated DC Date:  10/08/2013   Anticipated DC Plan:  HOME/SELF CARE      DC Planning Services  MATCH Program  CM consult      Choice offered to / List presented to:             Status of service:  Completed, signed off Medicare Important Message given?   (If response is "NO", the following Medicare IM given date fields will be blank) Date Medicare IM given:   Date Additional Medicare IM given:    Discharge Disposition:  HOME/SELF CARE  Per UR Regulation:    If discussed at Long Length of Stay Meetings, dates discussed:    Comments:  10/08/13 14:00 RN called CM for [facility] override for Percocet and cleocin.  Pt states she can get enough money together for $3/perscription.  Pt states her brother will be picking her up from hospital and she will be going home with brother.  MATCH letter given to pt and pt states she understands the restrictions of the [facility] program.  No other CM needs were communicated.  Freddy Jaksch, BSN, CM [phone].

## 2013-10-08 NOTE — Progress Notes (Signed)
Talked with Dr. Leta Baptist about pts c/o pain, will hold discharge.
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
## 2013-10-08 NOTE — Progress Notes (Signed)
Wilmon Arms. Corliss Skains, MD, [facility] Surgery  General/ Trauma Surgery  10/08/2013 3:51 PM
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
## 2013-10-09 NOTE — Progress Notes (Addendum)
POD#2 ORIF mandible, extraction teeth  D/c held secondary to pain  Temp:  [98.4 F (36.9 C)-100.1 F (37.8 C)] 98.4 F (36.9 C) (11/02 0529) Pulse Rate:  [55-79] 55 (11/02 0529) Resp:  [16-18] 16 (11/02 0529) BP: (104-120)/(57-76) 104/58 mmHg (11/02 0529) SpO2:  [95 %-100 %] 100 % (11/02 0529)  tolerating PO, pain better controlled  Decreased edema facial, expected induration No intraoral drainage Occlusion premorbid, mouth opening limited secondary to swelling  Home today  Glenna Fellows, MD [facility] Plastic & Reconstructive Surgery [phone]
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
## 2013-10-14 ENCOUNTER — Encounter (HOSPITAL_COMMUNITY): Payer: Self-pay | Admitting: Plastic Surgery

## 2013-11-20 ENCOUNTER — Emergency Department (HOSPITAL_COMMUNITY): Payer: No Typology Code available for payment source

## 2013-11-20 ENCOUNTER — Encounter (HOSPITAL_COMMUNITY): Payer: Self-pay | Admitting: Emergency Medicine

## 2013-11-20 ENCOUNTER — Emergency Department (HOSPITAL_COMMUNITY)
Admission: EM | Admit: 2013-11-20 | Discharge: 2013-11-20 | Disposition: A | Discharge status: Home / Self Care | Payer: No Typology Code available for payment source | Attending: Emergency Medicine | Admitting: Emergency Medicine

## 2013-11-20 DIAGNOSIS — N390 Urinary tract infection, site not specified: Secondary | ICD-10-CM | POA: Insufficient documentation

## 2013-11-20 DIAGNOSIS — F17200 Nicotine dependence, unspecified, uncomplicated: Secondary | ICD-10-CM | POA: Insufficient documentation

## 2013-11-20 DIAGNOSIS — N200 Calculus of kidney: Secondary | ICD-10-CM

## 2013-11-20 DIAGNOSIS — Z3202 Encounter for pregnancy test, result negative: Secondary | ICD-10-CM | POA: Insufficient documentation

## 2013-11-20 DIAGNOSIS — G8929 Other chronic pain: Secondary | ICD-10-CM | POA: Insufficient documentation

## 2013-11-20 LAB — URINE MICROSCOPIC-ADD ON

## 2013-11-20 LAB — URINALYSIS, ROUTINE W REFLEX MICROSCOPIC
Bilirubin Urine: NEGATIVE
Glucose, UA: NEGATIVE mg/dL
Nitrite: NEGATIVE
Specific Gravity, Urine: 1.025 (ref 1.005–1.030)
pH: 6 (ref 5.0–8.0)

## 2013-11-20 LAB — COMPREHENSIVE METABOLIC PANEL
ALT: 8 U/L (ref 0–35)
Albumin: 3.8 g/dL (ref 3.5–5.2)
Alkaline Phosphatase: 70 U/L (ref 39–117)
BUN: 12 mg/dL (ref 6–23)
Chloride: 101 mEq/L (ref 96–112)
Potassium: 3.7 mEq/L (ref 3.5–5.1)
Sodium: 137 mEq/L (ref 135–145)
Total Bilirubin: 0.5 mg/dL (ref 0.3–1.2)

## 2013-11-20 LAB — LIPASE, BLOOD: Lipase: 20 U/L (ref 11–59)

## 2013-11-20 LAB — CBC WITH DIFFERENTIAL/PLATELET
Basophils Relative: 0 % (ref 0–1)
Eosinophils Absolute: 0.1 10*3/uL (ref 0.0–0.7)
Hemoglobin: 14.2 g/dL (ref 12.0–15.0)
Lymphs Abs: 2 10*3/uL (ref 0.7–4.0)
MCH: 32.9 pg (ref 26.0–34.0)
MCV: 97.7 fL (ref 78.0–100.0)
Monocytes Relative: 7 % (ref 3–12)
Neutro Abs: 7.2 10*3/uL (ref 1.7–7.7)
Neutrophils Relative %: 72 % (ref 43–77)
Platelets: 259 10*3/uL (ref 150–400)
RBC: 4.31 MIL/uL (ref 3.87–5.11)
RDW: 13 % (ref 11.5–15.5)

## 2013-11-20 LAB — PREGNANCY, URINE: Preg Test, Ur: NEGATIVE

## 2013-11-20 MED ORDER — ONDANSETRON HCL 4 MG PO TABS: 4.0000 mg | ORAL_TABLET | Freq: Four times a day (QID) | ORAL | Status: DC

## 2013-11-20 MED ORDER — ONDANSETRON HCL 4 MG/2ML IJ SOLN
4.0000 mg | Freq: Once | INTRAMUSCULAR | Status: AC
  Administered 2013-11-20: 4 mg via INTRAVENOUS
  Filled 2013-11-20: qty 2

## 2013-11-20 MED ORDER — METRONIDAZOLE 500 MG PO TABS
2000.0000 mg | ORAL_TABLET | Freq: Once | ORAL | Status: AC
Start: 2013-11-20 — End: 2013-11-20
  Administered 2013-11-20: 2000 mg via ORAL
  Filled 2013-11-20 (×2): qty 4

## 2013-11-20 MED ORDER — KETOROLAC TROMETHAMINE 30 MG/ML IJ SOLN
30.0000 mg | Freq: Once | INTRAMUSCULAR | Status: AC
  Administered 2013-11-20: 30 mg via INTRAVENOUS
  Filled 2013-11-20: qty 1

## 2013-11-20 MED ORDER — CEPHALEXIN 500 MG PO CAPS: 500.0000 mg | ORAL_CAPSULE | Freq: Four times a day (QID) | ORAL | Status: DC

## 2013-11-20 MED ORDER — SODIUM CHLORIDE 0.9 % IV BOLUS (SEPSIS)
1000.0000 mL | Freq: Once | INTRAVENOUS | Status: AC
  Administered 2013-11-20: 1000 mL via INTRAVENOUS

## 2013-11-20 MED ORDER — DEXTROSE 5 % IV SOLN
1.0000 g | Freq: Once | INTRAVENOUS | Status: AC
  Administered 2013-11-20: 1 g via INTRAVENOUS
  Filled 2013-11-20: qty 10

## 2013-11-20 MED ORDER — IBUPROFEN 800 MG PO TABS: 800.0000 mg | ORAL_TABLET | Freq: Three times a day (TID) | ORAL | Status: DC

## 2013-11-20 MED ORDER — OXYCODONE-ACETAMINOPHEN 5-325 MG PO TABS: 2.0000 | ORAL_TABLET | ORAL | Status: DC | PRN

## 2013-11-20 MED ORDER — HYDROMORPHONE HCL PF 1 MG/ML IJ SOLN
1.0000 mg | Freq: Once | INTRAMUSCULAR | Status: AC
  Administered 2013-11-20: 1 mg via INTRAVENOUS
  Filled 2013-11-20: qty 1

## 2013-11-20 NOTE — ED Notes (Signed)
Per patient left flank pain that started this morning. Per patient nausea and vomiting. Denies any diarrhea or fevers. Denies noting any blood in urine.
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
## 2013-11-20 NOTE — ED Notes (Signed)
Pt c/o left flank pain that radiates to abdomen. Pt has hx of kidney stones and states symptoms are similar to past episodes. Pain began yesterday. Pt also reports N/V.

## 2013-11-21 LAB — URINE CULTURE: Colony Count: 15000

## 2013-11-27 ENCOUNTER — Inpatient Hospital Stay (HOSPITAL_COMMUNITY)
Admission: EM | Admit: 2013-11-27 | Discharge: 2013-11-28 | DRG: 694 | Disposition: A | Discharge status: Home / Self Care | Payer: Self-pay | Attending: Internal Medicine | Admitting: Internal Medicine

## 2013-11-27 ENCOUNTER — Encounter (HOSPITAL_COMMUNITY): Payer: Self-pay | Admitting: Emergency Medicine

## 2013-11-27 ENCOUNTER — Inpatient Hospital Stay (HOSPITAL_COMMUNITY): Payer: Self-pay

## 2013-11-27 DIAGNOSIS — G8929 Other chronic pain: Secondary | ICD-10-CM | POA: Diagnosis present

## 2013-11-27 DIAGNOSIS — N200 Calculus of kidney: Secondary | ICD-10-CM

## 2013-11-27 DIAGNOSIS — N1330 Unspecified hydronephrosis: Secondary | ICD-10-CM | POA: Diagnosis present

## 2013-11-27 DIAGNOSIS — Z833 Family history of diabetes mellitus: Secondary | ICD-10-CM

## 2013-11-27 DIAGNOSIS — N390 Urinary tract infection, site not specified: Secondary | ICD-10-CM | POA: Diagnosis present

## 2013-11-27 DIAGNOSIS — E860 Dehydration: Secondary | ICD-10-CM | POA: Diagnosis present

## 2013-11-27 DIAGNOSIS — Z87442 Personal history of urinary calculi: Secondary | ICD-10-CM

## 2013-11-27 DIAGNOSIS — Z23 Encounter for immunization: Secondary | ICD-10-CM

## 2013-11-27 DIAGNOSIS — N201 Calculus of ureter: Principal | ICD-10-CM | POA: Diagnosis present

## 2013-11-27 DIAGNOSIS — J069 Acute upper respiratory infection, unspecified: Secondary | ICD-10-CM

## 2013-11-27 DIAGNOSIS — F17200 Nicotine dependence, unspecified, uncomplicated: Secondary | ICD-10-CM | POA: Diagnosis present

## 2013-11-27 LAB — BASIC METABOLIC PANEL
GFR calc Af Amer: 86 mL/min — ABNORMAL LOW (ref >90)
GFR calc non Af Amer: 74 mL/min — ABNORMAL LOW (ref >90)
Potassium: 3.8 mEq/L (ref 3.5–5.1)
Sodium: 138 mEq/L (ref 135–145)

## 2013-11-27 LAB — URINALYSIS, ROUTINE W REFLEX MICROSCOPIC
Glucose, UA: 250 mg/dL — AB
Nitrite: POSITIVE — AB
Specific Gravity, Urine: 1.02 (ref 1.005–1.030)
Urobilinogen, UA: >8 mg/dL — ABNORMAL HIGH (ref 0.0–1.0)
pH: 6.5 (ref 5.0–8.0)

## 2013-11-27 LAB — SURGICAL PCR SCREEN
MRSA, PCR: NEGATIVE
Staphylococcus aureus: NEGATIVE

## 2013-11-27 LAB — CBC WITH DIFFERENTIAL/PLATELET
Band Neutrophils: 1 % (ref 0–10)
Basophils Absolute: 0 10*3/uL (ref 0.0–0.1)
Basophils Relative: 1 % (ref 0–1)
Eosinophils Absolute: 0 10*3/uL (ref 0.0–0.7)
Eosinophils Relative: 0 % (ref 0–5)
Hemoglobin: 13.9 g/dL (ref 12.0–15.0)
MCH: 32.6 pg (ref 26.0–34.0)
MCV: 93.4 fL (ref 78.0–100.0)
Metamyelocytes Relative: 0 %
Monocytes Absolute: 0.7 10*3/uL (ref 0.1–1.0)
Myelocytes: 0 %
Platelets: 175 10*3/uL (ref 150–400)
RBC: 4.27 MIL/uL (ref 3.87–5.11)
WBC: 4.4 10*3/uL (ref 4.0–10.5)

## 2013-11-27 LAB — URINE MICROSCOPIC-ADD ON

## 2013-11-27 LAB — PREGNANCY, URINE: Preg Test, Ur: NEGATIVE

## 2013-11-27 MED ORDER — PNEUMOCOCCAL VAC POLYVALENT 25 MCG/0.5ML IJ INJ
0.5000 mL | INJECTION | INTRAMUSCULAR | Status: DC
  Filled 2013-11-27: qty 0.5

## 2013-11-27 MED ORDER — ONDANSETRON HCL 4 MG/2ML IJ SOLN
4.0000 mg | Freq: Once | INTRAMUSCULAR | Status: AC
  Administered 2013-11-27: 4 mg via INTRAVENOUS
  Filled 2013-11-27: qty 2

## 2013-11-27 MED ORDER — ALBUTEROL SULFATE (5 MG/ML) 0.5% IN NEBU: 2.5000 mg | INHALATION_SOLUTION | RESPIRATORY_TRACT | Status: DC | PRN

## 2013-11-27 MED ORDER — SODIUM CHLORIDE 0.9 % IV SOLN: INTRAVENOUS | Status: AC

## 2013-11-27 MED ORDER — GUAIFENESIN-DM 100-10 MG/5ML PO SYRP
5.0000 mL | ORAL_SOLUTION | ORAL | Status: DC | PRN
  Administered 2013-11-27 (×2): 5 mL via ORAL
  Filled 2013-11-27 (×2): qty 5

## 2013-11-27 MED ORDER — DEXTROSE 5 % IV SOLN
1.0000 g | INTRAVENOUS | Status: DC
  Administered 2013-11-27: 1 g via INTRAVENOUS
  Filled 2013-11-27 (×2): qty 10

## 2013-11-27 MED ORDER — CIPROFLOXACIN IN D5W 400 MG/200ML IV SOLN
400.0000 mg | Freq: Once | INTRAVENOUS | Status: AC
  Administered 2013-11-27: 400 mg via INTRAVENOUS
  Filled 2013-11-27: qty 200

## 2013-11-27 MED ORDER — NICOTINE 14 MG/24HR TD PT24
14.0000 mg | MEDICATED_PATCH | Freq: Every day | TRANSDERMAL | Status: DC
  Administered 2013-11-27: 14 mg via TRANSDERMAL
  Filled 2013-11-27: qty 1

## 2013-11-27 MED ORDER — IPRATROPIUM-ALBUTEROL 20-100 MCG/ACT IN AERS
2.0000 | INHALATION_SPRAY | Freq: Four times a day (QID) | RESPIRATORY_TRACT | Status: DC
Start: 2013-11-27 — End: 2013-11-28
  Administered 2013-11-27 – 2013-11-28 (×3): 2 via RESPIRATORY_TRACT
  Filled 2013-11-27 (×2): qty 4

## 2013-11-27 MED ORDER — ONDANSETRON HCL 4 MG/2ML IJ SOLN: 4.0000 mg | Freq: Four times a day (QID) | INTRAMUSCULAR | Status: DC | PRN

## 2013-11-27 MED ORDER — HYDROMORPHONE HCL PF 1 MG/ML IJ SOLN: 1.0000 mg | INTRAMUSCULAR | Status: DC | PRN

## 2013-11-27 MED ORDER — ONDANSETRON HCL 4 MG/2ML IJ SOLN: 4.0000 mg | Freq: Three times a day (TID) | INTRAMUSCULAR | Status: DC | PRN

## 2013-11-27 MED ORDER — HYDROMORPHONE HCL PF 1 MG/ML IJ SOLN
1.0000 mg | Freq: Once | INTRAMUSCULAR | Status: AC
  Administered 2013-11-27: 1 mg via INTRAVENOUS
  Filled 2013-11-27: qty 1

## 2013-11-27 MED ORDER — ACETAMINOPHEN 325 MG PO TABS: 650.0000 mg | ORAL_TABLET | Freq: Four times a day (QID) | ORAL | Status: DC | PRN

## 2013-11-27 MED ORDER — DEXTROSE 5 % IV SOLN
1.0000 g | INTRAVENOUS | Status: DC
  Filled 2013-11-27: qty 10

## 2013-11-27 MED ORDER — SODIUM CHLORIDE 0.9 % IV SOLN
INTRAVENOUS | Status: DC
  Administered 2013-11-27 – 2013-11-28 (×2): via INTRAVENOUS

## 2013-11-27 MED ORDER — HYDROMORPHONE HCL PF 1 MG/ML IJ SOLN
0.5000 mg | INTRAMUSCULAR | Status: DC | PRN
  Administered 2013-11-27 – 2013-11-28 (×5): 1 mg via INTRAVENOUS
  Filled 2013-11-27 (×6): qty 1

## 2013-11-27 MED ORDER — INFLUENZA VAC SPLIT QUAD 0.5 ML IM SUSP
0.5000 mL | INTRAMUSCULAR | Status: DC
  Filled 2013-11-27: qty 0.5

## 2013-11-27 MED ORDER — ONDANSETRON HCL 4 MG PO TABS: 4.0000 mg | ORAL_TABLET | Freq: Four times a day (QID) | ORAL | Status: DC | PRN

## 2013-11-27 MED ORDER — OXYCODONE HCL 5 MG PO TABS: 5.0000 mg | ORAL_TABLET | ORAL | Status: DC | PRN

## 2013-11-27 MED ORDER — ACETAMINOPHEN 650 MG RE SUPP: 650.0000 mg | Freq: Four times a day (QID) | RECTAL | Status: DC | PRN

## 2013-11-27 NOTE — ED Provider Notes (Addendum)
CSN: [id number]     Arrival date & time 11/27/13  1006 History  This chart was scribed for Gwyneth Sprout, MD by Leone Payor, ED Scribe. This patient was seen in room APA07/AP[facility]07 and the patient's care was started 11:03 AM.    Chief Complaint  Patient presents with  . Nephrolithiasis    The history is provided by the patient. No language interpreter was used.    HPI Comments: Elaine Morgan is a 42 y.o. female with past medical history of kidney stones who presents to the Emergency Department complaining of ongoing, constant, worsened left flank and left lower abdominal pain that began over 1 week ago. She was seen on 11/20/13 for the same symptoms and was diagnosed with a 7 mm kidney stone and UTI. She was started on Keflex for this and was referred to a Urologist but he would not see her unless she was able to pay some money up front. She had some associated nausea which was relieved by Zofran. She reports running out of her pain medication a few days ago. She denies fever, vomiting.  Past Medical History  Diagnosis Date  . Chronic right shoulder pain   . Complication of anesthesia     ' I have a hard time breathing after surgery"  . Kidney stone   . Kidney stone    Past Surgical History  Procedure Laterality Date  . Tubal ligation    . Ankle fracture surgery    . Fracture surgery    . Orif mandibular fracture Bilateral 10/07/2013    Procedure: BILATERAL ORIF MANDIBULAR FRACTURE INTERMAXILLARY FIXATION/EXTRACTION OF TOOTH;  Surgeon: Glenna Fellows, MD;  Location: [facility] OR;  Service: Plastics;  Laterality: Bilateral;  ANESTHESIA: NASO TRACHEAL INTUBATION   No family history on file. History  Substance Use Topics  . Smoking status: Heavy Tobacco Smoker -- 1.00 packs/day for 25 years    Types: Cigarettes  . Smokeless tobacco: Never Used  . Alcohol Use: No   OB History   Grav Para Term Preterm Abortions TAB SAB Ect Mult Living   3 3 3       3      Review of Systems A complete  10 system review of systems was obtained and all systems are negative except as noted in the HPI and PMH.   Allergies  Hydrocodone  Home Medications   Current Outpatient Rx  Name  Route  Sig  Dispense  Refill  . Aspirin-Salicylamide-Caffeine (BC HEADACHE POWDER PO)   Oral   Take 1 packet by mouth daily as needed (headache).         . cephALEXin (KEFLEX) 500 MG capsule   Oral   Take 1 capsule (500 mg total) by mouth 4 (four) times daily.   40 capsule   0   . ondansetron (ZOFRAN) 4 MG tablet   Oral   Take 1 tablet (4 mg total) by mouth every 6 (six) hours.   12 tablet   0    BP 140/108  Pulse 72  Temp(Src) 97.8 F (36.6 C)  Resp 18  Ht 5\' 7"  (1.702 m)  Wt 190 lb (86.183 kg)  BMI 29.75 kg/m2  SpO2 98%  LMP 11/22/2013 Physical Exam  Nursing note and vitals reviewed. Constitutional: She is oriented to person, place, and time. She appears well-developed and well-nourished.  HENT:  Head: Normocephalic and atraumatic.  Cardiovascular: Normal rate, regular rhythm and normal heart sounds.   Pulmonary/Chest: Effort normal and breath sounds normal. No respiratory  distress. She has no wheezes. She has no rales.  Abdominal: Soft. She exhibits no distension and no mass. There is tenderness (LLQ and left flank). There is no rebound and no guarding.  Neurological: She is alert and oriented to person, place, and time.  Skin: Skin is warm and dry.  Psychiatric: She has a normal mood and affect.    ED Course  Procedures   DIAGNOSTIC STUDIES: Oxygen Saturation is 98% on RA, normal by my interpretation.    COORDINATION OF CARE: 11:09 AM Will order CBC, BMP, UA, urine culture. Discussed treatment plan with pt at bedside and pt agreed to plan.  Medications  HYDROmorphone (DILAUDID) injection 1 mg (not administered)  ondansetron (ZOFRAN) injection 4 mg (not administered)  ciprofloxacin (CIPRO) IVPB 400 mg (not administered)      Labs Review Labs Reviewed  URINALYSIS, ROUTINE  W REFLEX MICROSCOPIC - Abnormal; Notable for the following:    Color, Urine RED (*)    APPearance CLOUDY (*)    Glucose, UA 250 (*)    Hgb urine dipstick LARGE (*)    Bilirubin Urine SMALL (*)    Ketones, ur 40 (*)    Protein, ur >300 (*)    Urobilinogen, UA >8.0 (*)    Nitrite POSITIVE (*)    Leukocytes, UA LARGE (*)    All other components within normal limits  CBC WITH DIFFERENTIAL - Abnormal; Notable for the following:    Neutrophils Relative % 22 (*)    Lymphocytes Relative 61 (*)    Monocytes Relative 15 (*)    All other components within normal limits  BASIC METABOLIC PANEL - Abnormal; Notable for the following:    GFR calc non Af Amer 74 (*)    GFR calc Af Amer 86 (*)    All other components within normal limits  URINE CULTURE  URINE MICROSCOPIC-ADD ON   Imaging Review No results found.  EKG Interpretation   None       MDM   1. Kidney stone   2. UTI (lower urinary tract infection)     Patient with a history of an infected kidney stone that was diagnosed last Sunday at the emergency room. She's been taking Keflex pain medication and nausea medication since that time but symptoms have not resolved. She called the urology office twice but they stated in the she paid $100 up front she would not be seen.  Patient ran out of pain medication several days ago and is just had worsening pain and discomfort. UA today shows ongoing urinary tract infection and suspect 7 mm stone is still in the UVJ.  CBC and BMP pending to evaluate for renal insufficiency and leukocytosis. Patient has completed almost a week of Keflex without improvement she was given IV Cipro and pain control.  12:26 PM CBC and BMP wnl.  Pain improved after IV meds.  Will discuss with Dr. Jerre Simon.  12:41 PM Dr. Jerre Simon requested hospitalist admission and he will see the pt later today.  I personally performed the services described in this documentation, which was scribed in my presence.  The recorded  information has been reviewed and considered.   Gwyneth Sprout, MD 11/27/13 1242  Gwyneth Sprout, MD 11/27/13 1250

## 2013-11-27 NOTE — ED Notes (Signed)
Pt seen in ED last Sunday and dx with left side kidney stone and kidney infection. Pt states she is still taking antibiotic for infection and nausea medication, but she is out of pain medication.

## 2013-11-27 NOTE — ED Notes (Signed)
Pt  C/o flank pain, abd pain that has been constant sine being seen in er last week for uti and kidney stone, pt states that she was suppose to follow up with Dr. Jerre Simon this past week but was not to follow up with Dr. Bo Merino due to not having money for office visit, returns to er for further evaluation because she has ran out of her pain medication, admits to be taking the antibiotics that were prescribed,

## 2013-11-27 NOTE — H&P (Signed)
[facility] History and Physical  RAYVIN ABID ZOX:[id number] DOB: Sep 01, 1971 DOA: 11/27/2013   PCP: She does not have a PCP Specialists: None  Chief Complaint: Pain in the lower back  HPI: Elaine Morgan is a 42 y.o. female with no significant past medical history, who was in her usual state of health about a week ago, when she started noticing pain in the left lower back radiating to the front of the abdomen and towards her groin. She however, denies any dysuria, but has noticed dark urine, and may have seen some blood in it. Denies any fever or chills recently. She was having nausea, vomiting, earlier, but none recently. She also tells me that she may have had the flu last week and was quite sick and may have had a fever on Tuesday night. She had a cough and congestion in the chest, but those symptoms are getting better. She also had body aches, which is also improving. She still has dry lingering cough. She presented to the ED about a week ago, when she started having this lower back pain, and she was diagnosed with nephrolithiasis. She was found to have a 7 mm left UPJ calculus. She denies any vaginal discharge. She's currently having her period. Denies any previous history of kidney stones, though these have been seen on previous CT scans. Denies any diarrhea. She was referred to go to the urologist after her last visit to the emergency department. However, patient has not been able to do that for financial reasons and so, she returned back to the hospital.  Home Medications: Prior to Admission medications   Medication Sig Start Date End Date Taking? Authorizing Provider  Aspirin-Salicylamide-Caffeine (BC HEADACHE POWDER PO) Take 1 packet by mouth daily as needed (headache).   Yes Historical Provider, MD  cephALEXin (KEFLEX) 500 MG capsule Take 1 capsule (500 mg total) by mouth 4 (four) times daily. 11/20/13  Yes Glynn Octave, MD  ondansetron (ZOFRAN) 4 MG tablet Take 1 tablet (4 mg  total) by mouth every 6 (six) hours. 11/20/13  Yes Glynn Octave, MD    Allergies:  Allergies  Allergen Reactions  . Hydrocodone Itching and Nausea And Vomiting    Past Medical History: Past Medical History  Diagnosis Date  . Chronic right shoulder pain   . Complication of anesthesia     ' I have a hard time breathing after surgery"  . Kidney stone   . Kidney stone     Past Surgical History  Procedure Laterality Date  . Tubal ligation    . Ankle fracture surgery    . Fracture surgery    . Orif mandibular fracture Bilateral 10/07/2013    Procedure: BILATERAL ORIF MANDIBULAR FRACTURE INTERMAXILLARY FIXATION/EXTRACTION OF TOOTH;  Surgeon: Glenna Fellows, MD;  Location: [facility] OR;  Service: Plastics;  Laterality: Bilateral;  ANESTHESIA: NASO TRACHEAL INTUBATION    Social History: Patient lives with her brother-in-law. She's currently unemployed. She smokes one and half packs of cigarettes on a daily basis. No alcohol use. No illicit drug use. She's independent with daily activities. Denies being in any sexual relationship. Denies using contraceptives  Family History:She tells me that there is history of diabetes in the family.  Review of Systems - History obtained from the patient General ROS: positive for  - fatigue Psychological ROS: negative Ophthalmic ROS: negative ENT ROS: negative Allergy and Immunology ROS: negative Hematological and Lymphatic ROS: negative Endocrine ROS: negative Respiratory ROS: as in hpi Cardiovascular ROS: no chest pain or  dyspnea on exertion Gastrointestinal ROS: as in hpi Genito-Urinary ROS: as in hpi Musculoskeletal ROS: negative Neurological ROS: no TIA or stroke symptoms Dermatological ROS: negative  Physical Examination  Filed Vitals:   11/27/13 1016 11/27/13 1146  BP: 140/108 155/94  Pulse: 72 55  Temp: 97.8 F (36.6 C) 97.9 F (36.6 C)  TempSrc:  Oral  Resp: 18 17  Height: 5\' 7"  (1.702 m)   Weight: 86.183 kg (190 lb)   SpO2:  98% 98%    General appearance: alert, cooperative, appears stated age and no distress Head: Normocephalic, without obvious abnormality, atraumatic Eyes: conjunctivae/corneas clear. PERRL, EOM's intact. Throat: lips, mucosa, and tongue normal; teeth and gums normal Neck: no adenopathy, no carotid bruit, no JVD, supple, symmetrical, trachea midline and thyroid not enlarged, symmetric, no tenderness/mass/nodules Back: symmetric, no curvature. ROM normal. No CVA tenderness. Resp: Coarse breath sounds bilaterally, with a few scattered wheezing. No definite crackles. Cardio: regular rate and rhythm, S1, S2 normal, no murmur, click, rub or gallop GI: soft, non-tender; bowel sounds normal; no masses,  no organomegaly Extremities: extremities normal, atraumatic, no cyanosis or edema Pulses: 2+ and symmetric Skin: Skin color, texture, turgor normal. No rashes or lesions Lymph nodes: Cervical, supraclavicular, and axillary nodes normal. Neurologic: No focal deficits  Laboratory Data: Results for orders placed during the hospital encounter of 11/27/13 (from the past 48 hour(s))  URINALYSIS, ROUTINE W REFLEX MICROSCOPIC     Status: Abnormal   Collection Time    11/27/13 10:20 AM      Result Value Range   Color, Urine RED (*) YELLOW   Comment: BIOCHEMICALS MAY BE AFFECTED BY COLOR   APPearance CLOUDY (*) CLEAR   Specific Gravity, Urine 1.020  1.005 - 1.030   pH 6.5  5.0 - 8.0   Glucose, UA 250 (*) NEGATIVE mg/dL   Hgb urine dipstick LARGE (*) NEGATIVE   Bilirubin Urine SMALL (*) NEGATIVE   Ketones, ur 40 (*) NEGATIVE mg/dL   Protein, ur >161 (*) NEGATIVE mg/dL   Urobilinogen, UA >0.9 (*) 0.0 - 1.0 mg/dL   Nitrite POSITIVE (*) NEGATIVE   Leukocytes, UA LARGE (*) NEGATIVE  URINE MICROSCOPIC-ADD ON     Status: None   Collection Time    11/27/13 10:20 AM      Result Value Range   WBC, UA TOO NUMEROUS TO COUNT  <3 WBC/hpf   RBC / HPF TOO NUMEROUS TO COUNT  <3 RBC/hpf  CBC WITH DIFFERENTIAL      Status: Abnormal   Collection Time    11/27/13 11:07 AM      Result Value Range   WBC 4.4  4.0 - 10.5 K/uL   RBC 4.27  3.87 - 5.11 MIL/uL   Hemoglobin 13.9  12.0 - 15.0 g/dL   HCT 60.4  54.0 - 98.1 %   MCV 93.4  78.0 - 100.0 fL   MCH 32.6  26.0 - 34.0 pg   MCHC 34.8  30.0 - 36.0 g/dL   RDW 19.1  47.8 - 29.5 %   Platelets 175  150 - 400 K/uL   Neutrophils Relative % 22 (*) 43 - 77 %   Lymphocytes Relative 61 (*) 12 - 46 %   Monocytes Relative 15 (*) 3 - 12 %   Eosinophils Relative 0  0 - 5 %   Basophils Relative 1  0 - 1 %   Band Neutrophils 1  0 - 10 %   Metamyelocytes Relative 0     Myelocytes  0     Promyelocytes Absolute 0     Blasts 0     nRBC 0  0 /100 WBC   Smear Review LARGE PLATELETS PRESENT     Neutro Abs 1.0 (*) 1.7 - 7.7 K/uL   Lymphs Abs 2.7  0.7 - 4.0 K/uL   Monocytes Absolute 0.7  0.1 - 1.0 K/uL   Eosinophils Absolute 0.0  0.0 - 0.7 K/uL   Basophils Absolute 0.0  0.0 - 0.1 K/uL  BASIC METABOLIC PANEL     Status: Abnormal   Collection Time    11/27/13 11:07 AM      Result Value Range   Sodium 138  135 - 145 mEq/L   Potassium 3.8  3.5 - 5.1 mEq/L   Chloride 101  96 - 112 mEq/L   CO2 26  19 - 32 mEq/L   Glucose, Bld 98  70 - 99 mg/dL   BUN 9  6 - 23 mg/dL   Creatinine, Ser 0.10  0.50 - 1.10 mg/dL   Calcium 8.9  8.4 - 27.2 mg/dL   GFR calc non Af Amer 74 (*) >90 mL/min   GFR calc Af Amer 86 (*) >90 mL/min   Comment: (NOTE)     The eGFR has been calculated using the CKD EPI equation.     This calculation has not been validated in all clinical situations.     eGFR's persistently <90 mL/min signify possible Chronic Kidney     Disease.    Radiology Reports: No results found.  Problem List  Principal Problem:   Nephrolithiasis Active Problems:   URTI (acute upper respiratory infection)   UTI (lower urinary tract infection)   Dehydration   Assessment: This is a 42 year old, Caucasian female , who presents with lower back pain, radiating to the  front. She has a left UPJ stone with resulting hydronephrosis. She has an abnormal, UA likely, as a result of the stone and her menstrual periods. She's also recovering from what could have been flulike illness.  Plan: #1 nephrolithiasis with left UPJ stone and hydronephrosis: Urology has been consulted and they will evaluate the patient. We'll give her pain medications. Keep her n.p.o. for now. IV fluids will be provided. She'll be kept on intravenous antibiotics.  #2 recent flulike illness/URTI: She still has a dry cough. We'll get a chest x-ray. Give her inhalers and nebulizers as needed. Since she is afebrile we will not check for influenza.  #3 dehydration: Will give her IV fluids.  #4 tobacco abuse: Nicotine patch will be prescribed.  Urine pregnancy test will be obtained.  DVT Prophylaxis: SCDs Code Status: Full code Family Communication: Discuss with the patient  Disposition Plan: Observe to MedSurg   Further management decisions will depend on results of further testing and patient's response to treatment.  [facility]  [facility] Pager [phone]  If 7PM-7AM, please contact night-coverage [email or web address] Password [email or web address]  11/27/2013, 1:19 PM

## 2013-11-27 NOTE — Consult Note (Signed)
Note [phone]

## 2013-11-28 ENCOUNTER — Encounter (HOSPITAL_COMMUNITY): Payer: Self-pay | Admitting: Anesthesiology

## 2013-11-28 ENCOUNTER — Inpatient Hospital Stay (HOSPITAL_COMMUNITY): Payer: No Typology Code available for payment source

## 2013-11-28 ENCOUNTER — Encounter (HOSPITAL_COMMUNITY): Payer: Self-pay | Admitting: *Deleted

## 2013-11-28 ENCOUNTER — Observation Stay (HOSPITAL_COMMUNITY): Payer: Self-pay | Admitting: Anesthesiology

## 2013-11-28 ENCOUNTER — Inpatient Hospital Stay (HOSPITAL_COMMUNITY): Payer: Self-pay

## 2013-11-28 ENCOUNTER — Encounter (HOSPITAL_COMMUNITY)
Admission: EM | Disposition: A | Discharge status: Home / Self Care | Payer: Self-pay | Source: Home / Self Care | Attending: Internal Medicine

## 2013-11-28 HISTORY — PX: CYSTOSCOPY WITH STENT PLACEMENT: SHX5790

## 2013-11-28 LAB — COMPREHENSIVE METABOLIC PANEL
ALT: 18 U/L (ref 0–35)
AST: 21 U/L (ref 0–37)
Albumin: 3 g/dL — ABNORMAL LOW (ref 3.5–5.2)
Alkaline Phosphatase: 48 U/L (ref 39–117)
BUN: 7 mg/dL (ref 6–23)
CO2: 27 mEq/L (ref 19–32)
Calcium: 8.2 mg/dL — ABNORMAL LOW (ref 8.4–10.5)
Chloride: 103 mEq/L (ref 96–112)
Creatinine, Ser: 0.83 mg/dL (ref 0.50–1.10)
GFR calc Af Amer: >90 mL/min (ref >90)
GFR calc non Af Amer: 85 mL/min — ABNORMAL LOW (ref >90)
Glucose, Bld: 85 mg/dL (ref 70–99)
Potassium: 3.3 mEq/L — ABNORMAL LOW (ref 3.5–5.1)
Sodium: 139 mEq/L (ref 135–145)
Total Bilirubin: 0.2 mg/dL — ABNORMAL LOW (ref 0.3–1.2)
Total Protein: 6.5 g/dL (ref 6.0–8.3)

## 2013-11-28 LAB — CBC
MCH: 31.9 pg (ref 26.0–34.0)
MCHC: 33.5 g/dL (ref 30.0–36.0)
Platelets: 132 10*3/uL — ABNORMAL LOW (ref 150–400)
RBC: 3.7 MIL/uL — ABNORMAL LOW (ref 3.87–5.11)

## 2013-11-28 SURGERY — CYSTOSCOPY, WITH STENT INSERTION
Anesthesia: General | Site: Ureter | Laterality: Left

## 2013-11-28 MED ORDER — OXYCODONE HCL 5 MG PO TABS: 5.0000 mg | ORAL_TABLET | ORAL | Status: DC | PRN

## 2013-11-28 MED ORDER — MIDAZOLAM HCL 5 MG/5ML IJ SOLN
INTRAMUSCULAR | Status: DC | PRN
  Administered 2013-11-28: 2 mg via INTRAVENOUS

## 2013-11-28 MED ORDER — ONDANSETRON HCL 4 MG/2ML IJ SOLN
4.0000 mg | Freq: Once | INTRAMUSCULAR | Status: AC
  Administered 2013-11-28: 4 mg via INTRAVENOUS

## 2013-11-28 MED ORDER — CEPHALEXIN 500 MG PO CAPS: 500.0000 mg | ORAL_CAPSULE | Freq: Four times a day (QID) | ORAL | Status: DC

## 2013-11-28 MED ORDER — STERILE WATER FOR IRRIGATION IR SOLN
Status: DC | PRN
  Administered 2013-11-28: 500 mL

## 2013-11-28 MED ORDER — FENTANYL CITRATE 0.05 MG/ML IJ SOLN
INTRAMUSCULAR | Status: AC
  Filled 2013-11-28: qty 2

## 2013-11-28 MED ORDER — IPRATROPIUM-ALBUTEROL 20-100 MCG/ACT IN AERS
2.0000 | INHALATION_SPRAY | Freq: Three times a day (TID) | RESPIRATORY_TRACT | Status: DC
Start: 2013-11-28 — End: 2013-11-28

## 2013-11-28 MED ORDER — LIDOCAINE HCL (PF) 1 % IJ SOLN
INTRAMUSCULAR | Status: AC
  Filled 2013-11-28: qty 5

## 2013-11-28 MED ORDER — GLYCOPYRROLATE 0.2 MG/ML IJ SOLN
INTRAMUSCULAR | Status: AC
  Filled 2013-11-28: qty 2

## 2013-11-28 MED ORDER — GLYCOPYRROLATE 0.2 MG/ML IJ SOLN
INTRAMUSCULAR | Status: AC
  Filled 2013-11-28: qty 1

## 2013-11-28 MED ORDER — ROCURONIUM BROMIDE 100 MG/10ML IV SOLN
INTRAVENOUS | Status: DC | PRN
  Administered 2013-11-28: 10 mg via INTRAVENOUS
  Administered 2013-11-28: 20 mg via INTRAVENOUS

## 2013-11-28 MED ORDER — ONDANSETRON HCL 4 MG/2ML IJ SOLN
INTRAMUSCULAR | Status: AC
  Filled 2013-11-28: qty 2

## 2013-11-28 MED ORDER — ONDANSETRON HCL 4 MG/2ML IJ SOLN: 4.0000 mg | Freq: Once | INTRAMUSCULAR | Status: DC | PRN

## 2013-11-28 MED ORDER — MIDAZOLAM HCL 2 MG/2ML IJ SOLN
INTRAMUSCULAR | Status: AC
  Filled 2013-11-28: qty 2

## 2013-11-28 MED ORDER — SUCCINYLCHOLINE CHLORIDE 20 MG/ML IJ SOLN
INTRAMUSCULAR | Status: DC | PRN
  Administered 2013-11-28: 130 mg via INTRAVENOUS

## 2013-11-28 MED ORDER — FENTANYL CITRATE 0.05 MG/ML IJ SOLN
25.0000 ug | INTRAMUSCULAR | Status: DC | PRN
  Administered 2013-11-28: 50 ug via INTRAVENOUS

## 2013-11-28 MED ORDER — LACTATED RINGERS IV SOLN
INTRAVENOUS | Status: DC
  Administered 2013-11-28: 10:00:00 via INTRAVENOUS

## 2013-11-28 MED ORDER — PROPOFOL 10 MG/ML IV BOLUS
INTRAVENOUS | Status: DC | PRN
  Administered 2013-11-28: 140 mg via INTRAVENOUS

## 2013-11-28 MED ORDER — LIDOCAINE HCL 1 % IJ SOLN
INTRAMUSCULAR | Status: DC | PRN
  Administered 2013-11-28: 50 mg via INTRADERMAL

## 2013-11-28 MED ORDER — SODIUM CHLORIDE 0.9 % IR SOLN
Status: DC | PRN
  Administered 2013-11-28: 3000 mL

## 2013-11-28 MED ORDER — SUCCINYLCHOLINE CHLORIDE 20 MG/ML IJ SOLN
INTRAMUSCULAR | Status: AC
  Filled 2013-11-28: qty 1

## 2013-11-28 MED ORDER — POTASSIUM CHLORIDE CRYS ER 20 MEQ PO TBCR
40.0000 meq | EXTENDED_RELEASE_TABLET | Freq: Once | ORAL | Status: AC
  Administered 2013-11-28: 40 meq via ORAL
  Filled 2013-11-28: qty 2

## 2013-11-28 MED ORDER — MIDAZOLAM HCL 2 MG/2ML IJ SOLN
1.0000 mg | INTRAMUSCULAR | Status: DC | PRN
  Administered 2013-11-28: 2 mg via INTRAVENOUS

## 2013-11-28 MED ORDER — PROPOFOL 10 MG/ML IV BOLUS
INTRAVENOUS | Status: AC
  Filled 2013-11-28: qty 20

## 2013-11-28 MED ORDER — FENTANYL CITRATE 0.05 MG/ML IJ SOLN
INTRAMUSCULAR | Status: DC | PRN
  Administered 2013-11-28: 50 ug via INTRAVENOUS
  Administered 2013-11-28: 100 ug via INTRAVENOUS

## 2013-11-28 MED ORDER — ONDANSETRON HCL 4 MG PO TABS: 4.0000 mg | ORAL_TABLET | Freq: Four times a day (QID) | ORAL | Status: DC

## 2013-11-28 MED ORDER — GLYCOPYRROLATE 0.2 MG/ML IJ SOLN
INTRAMUSCULAR | Status: DC | PRN
  Administered 2013-11-28: 0.4 mg via INTRAVENOUS

## 2013-11-28 MED ORDER — NEOSTIGMINE METHYLSULFATE 1 MG/ML IJ SOLN
INTRAMUSCULAR | Status: DC | PRN
  Administered 2013-11-28: 1 mg via INTRAVENOUS
  Administered 2013-11-28: 2 mg via INTRAVENOUS

## 2013-11-28 MED ORDER — ROCURONIUM BROMIDE 50 MG/5ML IV SOLN
INTRAVENOUS | Status: AC
  Filled 2013-11-28: qty 1

## 2013-11-28 MED ORDER — GLYCOPYRROLATE 0.2 MG/ML IJ SOLN
0.2000 mg | Freq: Once | INTRAMUSCULAR | Status: AC
  Administered 2013-11-28: 0.2 mg via INTRAVENOUS

## 2013-11-28 SURGICAL SUPPLY — 18 items
BAG DRAIN URO TABLE W/ADPT NS (DRAPE) ×2 IMPLANT
CATH OPEN TIP 5FR (CATHETERS) ×2 IMPLANT
CLOTH BEACON ORANGE TIMEOUT ST (SAFETY) ×2 IMPLANT
GLOVE BIO SURGEON STRL SZ7 (GLOVE) ×2 IMPLANT
GLOVE EXAM NITRILE MD LF STRL (GLOVE) ×2 IMPLANT
GLOVE INDICATOR 7.0 STRL GRN (GLOVE) ×2 IMPLANT
GLOVE SS BIOGEL STRL SZ 6.5 (GLOVE) ×1 IMPLANT
GLOVE SUPERSENSE BIOGEL SZ 6.5 (GLOVE) ×1
GOWN STRL REIN XL XLG (GOWN DISPOSABLE) ×2 IMPLANT
IV NS IRRIG 3000ML ARTHROMATIC (IV SOLUTION) ×2 IMPLANT
KIT ROOM TURNOVER AP CYSTO (KITS) ×2 IMPLANT
MANIFOLD NEPTUNE II (INSTRUMENTS) ×2 IMPLANT
PACK CYSTO (CUSTOM PROCEDURE TRAY) ×2 IMPLANT
PAD ARMBOARD 7.5X6 YLW CONV (MISCELLANEOUS) ×2 IMPLANT
SET IRRIGATING DISP (SET/KITS/TRAYS/PACK) ×2 IMPLANT
STENT PERCUFLEX 4.8FRX24 (STENTS) ×2 IMPLANT
TOWEL OR 17X26 4PK STRL BLUE (TOWEL DISPOSABLE) ×2 IMPLANT
WIRE GUIDE BENTSON .035 15CM (WIRE) ×2 IMPLANT

## 2013-11-28 NOTE — Brief Op Note (Signed)
11/27/2013 - 11/28/2013  11:01 AM  PATIENT:  Elaine Morgan  42 y.o. female  PRE-OPERATIVE DIAGNOSIS:  l renal stone  POST-OPERATIVE DIAGNOSIS:  * No post-op diagnosis entered *  PROCEDURE:  Procedure(s): CYSTOSCOPY WITH STENT PLACEMENT (Left)  SURGEON:  Surgeon(s) and Role:    * Ky Barban, MD - Primary  PHYSICIAN ASSISTANT:   ASSISTANTS: none   ANESTHESIA:   general  EBL:  Total I/O In: 600 [I.V.:600] Out: -   BLOOD ADMINISTERED:none  DRAINS: l double j stentno string   LOCAL MEDICATIONS USED:  NONE  SPECIMEN:  No Specimen  DISPOSITION OF SPECIMEN:  N/A  COUNTS:  YES  TOURNIQUET:  * No tourniquets in log *  DICTATION: .Other Dictation: Dictation Number dictation [id number]  PLAN OF CARE: Admit for overnight observation  PATIENT DISPOSITION:  PACU - hemodynamically stable.   Delay start of Pharmacological VTE agent (>24hrs) due to surgical blood loss or risk of bleeding: no

## 2013-11-28 NOTE — Anesthesia Procedure Notes (Signed)
Procedure Name: Intubation Date/Time: 11/28/2013 10:30 AM Performed by: Despina Hidden Pre-anesthesia Checklist: Emergency Drugs available, Suction available, Patient being monitored and Patient identified Patient Re-evaluated:Patient Re-evaluated prior to inductionOxygen Delivery Method: Circle system utilized Preoxygenation: Pre-oxygenation with 100% oxygen Intubation Type: IV induction and Cricoid Pressure applied Ventilation: Mask ventilation without difficulty Laryngoscope Size: Mac and 3 Grade View: Grade II Tube type: Oral Tube size: 7.0 mm Number of attempts: 1 Airway Equipment and Method: Stylet Placement Confirmation: ETT inserted through vocal cords under direct vision,  positive ETCO2 and breath sounds checked- equal and bilateral Secured at: 22 cm Tube secured with: Tape Dental Injury: Teeth and Oropharynx as per pre-operative assessment  Difficulty Due To: Difficult Airway- due to limited oral opening

## 2013-11-28 NOTE — Care Management Note (Signed)
    Page 1 of 1   11/28/2013     1:17:16 PM   CARE MANAGEMENT NOTE 11/28/2013  Patient:  Elaine Morgan, Elaine Morgan   Account Number:  [id number]  Date Initiated:  11/28/2013  Documentation initiated by:  Rosemary Holms  Subjective/Objective Assessment:   Pt admitted from home. No PCP. No Insurance     Action/Plan:   Call [facility] if acceptable with pt.   Anticipated DC Date:  11/28/2013   Anticipated DC Plan:  HOME/SELF CARE      DC Planning Services  CM consult      Choice offered to / List presented to:             Status of service:  Completed, signed off Medicare Important Message given?   (If response is "NO", the following Medicare IM given date fields will be blank) Date Medicare IM given:   Date Additional Medicare IM given:    Discharge Disposition:    Per UR Regulation:    If discussed at Long Length of Stay Meetings, dates discussed:    Comments:  11/28/13 Rosemary Holms RN BSN CM 1310 Pt agreed to [facility] clinic. Eligibility appt made for 12/09/13 at 2:30. After this appt, they will make her a medical appt. Shon Hale, RN advised and will write this information on her DC papers.

## 2013-11-28 NOTE — Op Note (Signed)
[phone]

## 2013-11-28 NOTE — Consult Note (Signed)
NAME:  Elaine Morgan, Elaine Morgan                 ACCOUNT NO.:  [id number]  MEDICAL RECORD NO.:  [id number]  LOCATION:  A324                          FACILITY:  [facility]  PHYSICIAN:  Ky Barban, M.D.DATE OF BIRTH:  Dec 09, 1970  DATE OF CONSULTATION: DATE OF DISCHARGE:                                CONSULTATION   HISTORY OF PRESENT ILLNESS:  This 42 year old female was in the ER about a week ago with left renal colic.  CT scan showed there is a 7-mm stone in the left UPJ causing obstruction.  She was referred to the office, but I did not see her in the office.  She comes back today with worse pain; no fever, or chills.  She may have history of having gross hematuria also.  That stone is on KUB still in the same area.  She was having nausea, vomiting earlier, but none recently.  She is also having flu-like symptoms, so she may have fever.  It is difficult to tell if it is coming from the kidney, infection, or she had fever from flu.  She has no history of having diarrhea.  She never had any kidney stones before.  She is feeling comfortable right now.  ALLERGIES:  SHE IS ALLERGIC TO HYDROCODONE, CAUSES ITCHING, NAUSEA, AND VOMITING.  PAST MEDICAL HISTORY:  She has a history of having chronic right shoulder pain, complication of anesthesia had a hard time breathing after surgery.  She never had any kidney stones.  PAST SURGICAL HISTORY:  Tubal ligation, ankle fracture surgery, and fracture surgery.  ORIF-mandibular fracture.  PERSONAL HISTORY:  She lives via with her brother-in-law.  She is currently unemployed.  She smokes 1-1/2-pack of cigarette on daily basis.  No alcohol or illicit drug use.  FAMILY HISTORY:  Negative.  REVIEW OF SYSTEMS:  Unremarkable.  PHYSICAL EXAMINATION:  GENERAL:  Moderately built female, not in acute distress. VITAL SIGNS:  Blood pressure 140/108 on admission.  Now it is 155/94, pulse 55 per minute,  and temperature 97.9. ABDOMEN:  Soft, flat.  Liver, spleen,  and kidneys are not palpable, 1+ left CVA tenderness. PELVIC:  Exam is deferred. EXTREMITIES:  Normal.  LABORATORY DATA:  Urinalysis shows hemoglobin urine dipstick large amount.  Leukocytes large, WBC too numerous to count, RBC too numerous to count.  WBC count is 4.4, hematocrit 39.9.  Sodium 138, potassium 3.8, chloride 101, CO2 is 26, BUN is 9, and creatinine 0.93.  IMPRESSION:  Left renal calculus with hydronephrosis.  We Will do lithotripsy and schedule her for ESL as outpatient.  I discussed with the patient.  Discussed the options and I recommended that she undergo ESL.  She agrees with that and I told her that it does not work 100% of the time, but there is a good chance it will break and once the stone is broken then I will remove the stent.  We will put the double-J stent tomorrow under anesthesia, IV sedation.     Ky Barban, M.D.     MIJ/MEDQ  D:  11/27/2013  T:  11/28/2013  Job:  [id number]

## 2013-11-28 NOTE — Progress Notes (Signed)
AVS reviewed with patient.  Prescriptions provided to patient.  Patient verbalized understanding of discharge instructions, physician follow-up (Dr. Jerre Simon and [facility]) and medications.  Patient's IV removed site WNL.  No redness, tenderness or edema.  Patient reports all belongings intact and in possession at time of discharge.  Patient refused wheelchair.  Patient ambulated to ED entrance for discharge.  Patient's brother providing ride home.

## 2013-11-28 NOTE — Anesthesia Preprocedure Evaluation (Addendum)
Anesthesia Evaluation  Patient identified by MRN, date of birth, ID band Patient awake    Reviewed: Allergy & Precautions, H&P , NPO status , Patient's Chart, lab work & pertinent test results, reviewed documented beta blocker date and time   History of Anesthesia Complications History of anesthetic complications: hard time breathing after surgery per pt.   Airway Mallampati: II TM Distance: >3 FB Neck ROM: Full  Mouth opening: Limited Mouth Opening  Dental  (+) Dental Advisory Given, Missing and Edentulous Upper,    Pulmonary Current Smoker,  breath sounds clear to auscultation        Cardiovascular negative cardio ROS  Rhythm:Regular Rate:Normal     Neuro/Psych Anxiety    GI/Hepatic negative GI ROS, Neg liver ROS,   Endo/Other  negative endocrine ROS  Renal/GU Renal InsufficiencyRenal disease  negative genitourinary   Musculoskeletal negative musculoskeletal ROS (+)   Abdominal (+)  Abdomen: soft. Bowel sounds: normal.  Peds negative pediatric ROS (+)  Hematology negative hematology ROS (+)   Anesthesia Other Findings   Reproductive/Obstetrics negative OB ROS                         Anesthesia Physical Anesthesia Plan  ASA: III  Anesthesia Plan: General   Post-op Pain Management:    Induction: Intravenous  Airway Management Planned: Oral ETT  Additional Equipment:   Intra-op Plan:   Post-operative Plan: Extubation in OR  Informed Consent: I have reviewed the patients History and Physical, chart, labs and discussed the procedure including the risks, benefits and alternatives for the proposed anesthesia with the patient or authorized representative who has indicated his/her understanding and acceptance.   Dental advisory given  Plan Discussed with: CRNA and Anesthesiologist  Anesthesia Plan Comments:         Anesthesia Quick Evaluation

## 2013-11-28 NOTE — Transfer of Care (Signed)
Immediate Anesthesia Transfer of Care Note  Patient: Elaine Morgan  Procedure(s) Performed: Procedure(s): CYSTOSCOPY WITH STENT PLACEMENT (Left)  Patient Location: PACU  Anesthesia Type:General  Level of Consciousness: awake and patient cooperative  Airway & Oxygen Therapy: Patient Spontanous Breathing and Patient connected to face mask oxygen  Post-op Assessment: Report given to PACU RN, Post -op Vital signs reviewed and stable and Patient moving all extremities X 4  Post vital signs: Reviewed and stable  Complications: No apparent anesthesia complications

## 2013-11-28 NOTE — Discharge Summary (Signed)
[facility]                                                                                   Elaine Morgan, is a 42 y.o. female  DOB 1971-03-19  MRN [id number].  Admission date:  11/27/2013  Admitting Physician  Osvaldo Shipper, MD  Discharge Date:  11/28/2013   Primary MD  No PCP Per Patient  Recommendations for primary care physician for things to follow:   Please insure patient follows with a urologist within a week, follow up on final urine culture results   Admission Diagnosis  Dehydration [276.51] Kidney stone [592.0] Nephrolithiasis [592.0] URTI (acute upper respiratory infection) [465.9] UTI (lower urinary tract infection) [599.0]  Discharge Diagnosis   L.UPJ stone  Principal Problem:   Nephrolithiasis Active Problems:   URTI (acute upper respiratory infection)   UTI (lower urinary tract infection)   Dehydration   Kidney stone      Past Medical History  Diagnosis Date  . Chronic right shoulder pain   . Complication of anesthesia     ' I have a hard time breathing after surgery"  . Kidney stone   . Kidney stone     Past Surgical History  Procedure Laterality Date  . Tubal ligation    . Ankle fracture surgery    . Fracture surgery    . Orif mandibular fracture Bilateral 10/07/2013    Procedure: BILATERAL ORIF MANDIBULAR FRACTURE INTERMAXILLARY FIXATION/EXTRACTION OF TOOTH;  Surgeon: Glenna Fellows, MD;  Location: [facility] OR;  Service: Plastics;  Laterality: Bilateral;  ANESTHESIA: NASO TRACHEAL INTUBATION     Discharge Condition: stable       Follow-up Information   Follow up with Ky Barban, MD. Schedule an appointment as soon as possible for a visit in 3 days. (and your PCP)    Specialty:  Urology   Contact information:   [address] [place] [address] [phone]         Consults obtained - Urology   Discharge Medications      Medication List         [facility] HEADACHE POWDER PO  Take 1 packet by mouth daily as  needed (headache).     cephALEXin 500 MG capsule  Commonly known as:  KEFLEX  Take 1 capsule (500 mg total) by mouth 4 (four) times daily.     ondansetron 4 MG tablet  Commonly known as:  ZOFRAN  Take 1 tablet (4 mg total) by mouth every 6 (six) hours.     oxyCODONE 5 MG immediate release tablet  Commonly known as:  Oxy IR/ROXICODONE  Take 1 tablet (5 mg total) by mouth every 4 (four) hours as needed for moderate pain.         Diet and Activity recommendation: See Discharge Instructions below   Discharge Instructions     Follow with Primary MD and Urologist in 7 days   Get CBC, CMP, checked 7 days by Primary MD and again as instructed by your Primary MD. Get a 2 view Chest X ray done next visit if you had Pneumonia of Lung problems at the Hospital.  Get Medicines reviewed and adjusted.  Please  request your Prim.MD to go over all Hospital Tests and Procedure/Radiological results at the follow up, please get all Hospital records sent to your Prim MD by signing hospital release before you go home.  Activity: As tolerated with Full fall precautions use walker/cane & assistance as needed   Diet:  Heart Healthy  For Heart failure patients - Check your Weight same time everyday, if you gain over 2 pounds, or you develop in leg swelling, experience more shortness of breath or chest pain, call your Primary MD immediately. Follow Cardiac Low Salt Diet and 1.8 lit/day fluid restriction.  Disposition Home   If you experience worsening of your admission symptoms, develop shortness of breath, life threatening emergency, suicidal or homicidal thoughts you must seek medical attention immediately by calling 911 or calling your MD immediately  if symptoms less severe.  You Must read complete instructions/literature along with all the possible adverse reactions/side effects for all the Medicines you take and that have been prescribed to you. Take any new Medicines after you have completely  understood and accpet all the possible adverse reactions/side effects.   Do not drive and provide baby sitting services if your were admitted for syncope or siezures until you have seen by Primary MD or a Neurologist and advised to do so again.  Do not drive when taking Pain medications.    Do not take more than prescribed Pain, Sleep and Anxiety Medications  Special Instructions: If you have smoked or chewed Tobacco  in the last 2 yrs please stop smoking, stop any regular Alcohol  and or any Recreational drug use.  Wear Seat belts while driving.   Please note  You were cared for by a hospitalist during your hospital stay. If you have any questions about your discharge medications or the care you received while you were in the hospital after you are discharged, you can call the unit and asked to speak with the hospitalist on call if the hospitalist that took care of you is not available. Once you are discharged, your primary care physician will handle any further medical issues. Please note that NO REFILLS for any discharge medications will be authorized once you are discharged, as it is imperative that you return to your primary care physician (or establish a relationship with a primary care physician if you do not have one) for your aftercare needs so that they can reassess your need for medications and monitor your lab values.  Major procedures and Radiology Reports - PLEASE review detailed and final reports for all details, in brief -    Left Double J stent placement   Ct Abdomen Pelvis Wo Contrast  11/20/2013   CLINICAL DATA:  Left flank pain radiating to abdomen since yesterday, history kidney stones, nausea, vomiting  EXAM: CT ABDOMEN AND PELVIS WITHOUT CONTRAST  TECHNIQUE: Multidetector CT imaging of the abdomen and pelvis was performed following the standard protocol without intravenous contrast. Sagittal and coronal MPR images reconstructed from axial data set. Oral contrast not  administered for this indication  COMPARISON:  05/23/2013  FINDINGS: Lung bases clear.  Left hydronephrosis secondary to a 7 mm diameter left UPJ calculus image 33.  No ureteral calcification or dilatation.  Additional tiny nonobstructing calculus at inferior pole left kidney.  Within limits of a nonenhanced exam no additional focal abnormalities of the liver, spleen, pancreas, kidneys, or adrenal glands.  Unremarkable bladder, ureters, uterus and adnexae.  Short segment of the proximal appendix is noted, normal appearance.  Stomach and  bowel loops normal appearance.  No mass, adenopathy, free fluid or inflammatory process.  Degenerative disc disease changes L5-S1.  IMPRESSION: Left hydronephrosis secondary to a 7 mm left UPJ calculus.   Electronically Signed   By: Ulyses Southward M.D.   On: 11/20/2013 18:51   Dg Chest 2 View  11/27/2013   CLINICAL DATA:  Kidney stones.  Cough and congestion.  EXAM: CHEST  2 VIEW  COMPARISON:  None.  FINDINGS: The heart size and mediastinal contours are within normal limits. Both lungs are clear. The visualized skeletal structures are unremarkable.  IMPRESSION: No active cardiopulmonary disease.   Electronically Signed   By: Maisie Fus  Register   On: 11/27/2013 15:41    Micro Results      Recent Results (from the past 240 hour(s))  URINE CULTURE     Status: None   Collection Time    11/20/13  5:58 PM      Result Value Range Status   Specimen Description URINE, CLEAN CATCH   Final   Special Requests NONE   Final   Culture  Setup Time     Final   Value: 11/20/2013 22:30     Performed at [facility] Count     Final   Value: 15,000 COLONIES/ML     Performed at [facility]   Culture     Final   Value: Multiple bacterial morphotypes present, none predominant. Suggest appropriate recollection if clinically indicated.     Performed at [facility]   Report Status 11/21/2013 FINAL   Final  SURGICAL PCR SCREEN     Status: None    Collection Time    11/27/13  8:03 PM      Result Value Range Status   MRSA, PCR NEGATIVE  NEGATIVE Final   Staphylococcus aureus NEGATIVE  NEGATIVE Final   Comment:            The Xpert SA Assay (FDA     approved for NASAL specimens     in patients over 27 years of age),     is one component of     a comprehensive surveillance     program.  Test performance has     been validated by [employer] for patients greater     than or equal to 20 year old.     It is not intended     to diagnose infection nor to     guide or monitor treatment.     History of present illness and  Hospital Course:     Kindly see H&P for history of present illness and admission details, please review complete Labs, Consult reports and Test reports for all details in brief Elaine Morgan, is a 42 y.o. female, patient with history of  Smoking counseled to quit, who was recently diagnosed with left-sided kidney in UPJ stone initially in the outpatient setting and was supposed to see urologist in the office, however due to insurance issues she could not get her procedure done in the outpatient setting and presented to the ER with continued left-sided discomfort and pain. Urology was consulted and she was taken to the OR for left-sided double-J ureteric stent placement, which she underwent successfully. I discussed her case with the urologist on call Dr. Jerre Simon who suggested that patient could be discharged a few hours after the procedure on oral Keflex and that she should followup with him in the office for  eventual lithotripsy.  She will be placed on appropriate antibiotic along with pain medications and instructed to follow with her primary care physician and urologist in a timely manner. For her smoking she has been extensively counseled to quit smoking.    Today   Subjective:   Elaine Morgan today has no headache,no chest abdominal pain,no new weakness tingling or numbness, feels much better wants to go home  today.    Objective:   Blood pressure 137/55, pulse 67, temperature 97.5 F (36.4 C), temperature source Oral, resp. rate 18, height 5\' 7"  (1.702 m), weight 81.421 kg (179 lb 8 oz), last menstrual period 11/22/2013, SpO2 96.00%.   Intake/Output Summary (Last 24 hours) at 11/28/13 1219 Last data filed at 11/28/13 1154  Gross per 24 hour  Intake 2596.67 ml  Output    250 ml  Net 2346.67 ml    Exam Awake Alert, Oriented *3, No new F.N deficits, Normal affect [place].AT,PERRAL Supple Neck,No JVD, No cervical lymphadenopathy appriciated.  Symmetrical Chest wall movement, Good air movement bilaterally, CTAB RRR,No Gallops,Rubs or new Murmurs, No Parasternal Heave +ve B.Sounds, Abd Soft, Non tender, No organomegaly appriciated, No rebound -guarding or rigidity. No Cyanosis, Clubbing or edema, No new Rash or bruise  Data Review   CBC w Diff: Lab Results  Component Value Date   WBC 4.9 11/28/2013   HGB 11.8* 11/28/2013   HCT 35.2* 11/28/2013   PLT 132* 11/28/2013   LYMPHOPCT 61* 11/27/2013   BANDSPCT 1 11/27/2013   MONOPCT 15* 11/27/2013   EOSPCT 0 11/27/2013   BASOPCT 1 11/27/2013    CMP: Lab Results  Component Value Date   NA 139 11/28/2013   K 3.3* 11/28/2013   CL 103 11/28/2013   CO2 27 11/28/2013   BUN 7 11/28/2013   CREATININE 0.83 11/28/2013   PROT 6.5 11/28/2013   ALBUMIN 3.0* 11/28/2013   BILITOT 0.2* 11/28/2013   ALKPHOS 48 11/28/2013   AST 21 11/28/2013   ALT 18 11/28/2013  .   Total Time in preparing paper work, data evaluation and todays exam - 35 minutes  Leroy Sea M.D on 11/28/2013 at 12:19 PM  [facility] Office  [phone]

## 2013-11-28 NOTE — Anesthesia Postprocedure Evaluation (Signed)
  Anesthesia Post-op Note  Patient: Elaine Morgan  Procedure(s) Performed: Procedure(s): CYSTOSCOPY WITH STENT PLACEMENT (Left)  Patient Location: PACU  Anesthesia Type:General  Level of Consciousness: awake, alert , oriented and patient cooperative  Airway and Oxygen Therapy: Patient Spontanous Breathing  Post-op Pain: none  Post-op Assessment: Post-op Vital signs reviewed, Patient's Cardiovascular Status Stable, Respiratory Function Stable, Patent Airway and Pain level controlled  Post-op Vital Signs: Reviewed and stable  Complications: No apparent anesthesia complications

## 2013-11-29 ENCOUNTER — Encounter (HOSPITAL_COMMUNITY): Payer: Self-pay | Admitting: Urology

## 2013-11-29 LAB — URINE CULTURE

## 2013-11-29 NOTE — Op Note (Signed)
Elaine Morgan, Elaine Morgan                 ACCOUNT NO.:  [id number]  MEDICAL RECORD NO.:  [id number]  LOCATION:  A324                          FACILITY:  [facility]  PHYSICIAN:  Ky Barban, M.D.DATE OF BIRTH:  1971/02/19  DATE OF PROCEDURE: DATE OF DISCHARGE:  11/28/2013                              OPERATIVE REPORT   PREOPERATIVE DIAGNOSIS:  Left renal calculus.  POSTOPERATIVE DIAGNOSIS:  Left upper ureteral calculus.  ANESTHESIA:  General.  DESCRIPTION OF PROCEDURE:  The patient underwent general anesthesia in lithotomy position.  After usual prep and drape, #25 cystoscope was introduced into the bladder and inspected.  No tumor, stone, foreign body, or inflammation.  Left ureteral orifice catheterized with a guidewire, which was advanced up into the renal pelvis without any difficulty.  Stone in the left upper ureter was seen.  Then, the open- ended catheter was introduced into the left renal pelvis over the guidewire.  At this point, the guidewire was removed.  Hydronephrotic drip was obtained.  The guidewire was reintroduced and the open-end catheter was removed.  On removing the open-end catheter 5-French 24 double-J stent was positioned between the renal pelvis on the bladder. Nice loop in the kidney and the renal pelvis and the bladder was obtained after removing the guidewire.  All the instruments were removed.  The patient left the operating room in satisfactory condition.     Ky Barban, M.D.     MIJ/MEDQ  D:  11/28/2013  T:  11/29/2013  Job:  [id number]

## 2013-12-11 ENCOUNTER — Emergency Department (HOSPITAL_COMMUNITY): Payer: Self-pay

## 2013-12-11 ENCOUNTER — Encounter (HOSPITAL_COMMUNITY): Payer: Self-pay | Admitting: Emergency Medicine

## 2013-12-11 ENCOUNTER — Emergency Department (HOSPITAL_COMMUNITY)
Admission: EM | Admit: 2013-12-11 | Discharge: 2013-12-11 | Disposition: A | Discharge status: Home / Self Care | Payer: Self-pay | Attending: Emergency Medicine | Admitting: Emergency Medicine

## 2013-12-11 DIAGNOSIS — Z87442 Personal history of urinary calculi: Secondary | ICD-10-CM | POA: Insufficient documentation

## 2013-12-11 DIAGNOSIS — F17200 Nicotine dependence, unspecified, uncomplicated: Secondary | ICD-10-CM | POA: Insufficient documentation

## 2013-12-11 DIAGNOSIS — G8929 Other chronic pain: Secondary | ICD-10-CM | POA: Insufficient documentation

## 2013-12-11 DIAGNOSIS — Z9851 Tubal ligation status: Secondary | ICD-10-CM | POA: Insufficient documentation

## 2013-12-11 DIAGNOSIS — N23 Unspecified renal colic: Secondary | ICD-10-CM | POA: Insufficient documentation

## 2013-12-11 DIAGNOSIS — Z792 Long term (current) use of antibiotics: Secondary | ICD-10-CM | POA: Insufficient documentation

## 2013-12-11 LAB — URINALYSIS, ROUTINE W REFLEX MICROSCOPIC
Glucose, UA: NEGATIVE mg/dL
Ketones, ur: 15 mg/dL — AB
Nitrite: NEGATIVE
PH: 6 (ref 5.0–8.0)
Protein, ur: 100 mg/dL — AB
Urobilinogen, UA: 0.2 mg/dL (ref 0.0–1.0)

## 2013-12-11 LAB — URINE MICROSCOPIC-ADD ON

## 2013-12-11 MED ORDER — KETOROLAC TROMETHAMINE 30 MG/ML IJ SOLN
30.0000 mg | Freq: Once | INTRAMUSCULAR | Status: AC
  Administered 2013-12-11: 30 mg via INTRAVENOUS
  Filled 2013-12-11: qty 1

## 2013-12-11 MED ORDER — ONDANSETRON HCL 4 MG/2ML IJ SOLN
4.0000 mg | Freq: Once | INTRAMUSCULAR | Status: AC
  Administered 2013-12-11: 4 mg via INTRAVENOUS
  Filled 2013-12-11: qty 2

## 2013-12-11 MED ORDER — SODIUM CHLORIDE 0.9 % IV SOLN
1000.0000 mL | Freq: Once | INTRAVENOUS | Status: AC
  Administered 2013-12-11: 1000 mL via INTRAVENOUS

## 2013-12-11 MED ORDER — OXYCODONE-ACETAMINOPHEN 5-325 MG PO TABS
1.0000 | ORAL_TABLET | Freq: Once | ORAL | Status: AC
  Administered 2013-12-11: 1 via ORAL
  Filled 2013-12-11: qty 1

## 2013-12-11 MED ORDER — MORPHINE SULFATE 4 MG/ML IJ SOLN
6.0000 mg | Freq: Once | INTRAMUSCULAR | Status: AC
  Administered 2013-12-11: 6 mg via INTRAVENOUS
  Filled 2013-12-11: qty 2

## 2013-12-11 MED ORDER — SODIUM CHLORIDE 0.9 % IV SOLN: 1000.0000 mL | INTRAVENOUS | Status: DC

## 2013-12-11 NOTE — ED Notes (Signed)
Pt reports taking two Percocet at roughly 2200.

## 2013-12-11 NOTE — ED Provider Notes (Signed)
CSN: [id number]     Arrival date & time 12/11/13  0214 History   First MD Initiated Contact with Patient 12/11/13 0310     Chief Complaint  Patient presents with  . Flank Pain    HPI Patient underwent stent placement of her left ureter on 11/28/2013 for left-sided ureteral stone.  The patient has been doing well over the past several days but developed worsening left flank pain over the past several hours.  Nausea without vomiting.  She denies diarrhea.  She attempted to take her pain medication at home without improvement in her symptoms.  Nothing worsens her pain.  Nothing improves her pain.  She does have some urinary pressure but denies dysuria.   Past Medical History  Diagnosis Date  . Chronic right shoulder pain   . Complication of anesthesia     ' I have a hard time breathing after surgery"  . Kidney stone   . Kidney stone    Past Surgical History  Procedure Laterality Date  . Tubal ligation    . Ankle fracture surgery    . Fracture surgery    . Orif mandibular fracture Bilateral 10/07/2013    Procedure: BILATERAL ORIF MANDIBULAR FRACTURE INTERMAXILLARY FIXATION/EXTRACTION OF TOOTH;  Surgeon: Glenna FellowsBrinda Thimmappa, MD;  Location: [facility] OR;  Service: Plastics;  Laterality: Bilateral;  ANESTHESIA: NASO TRACHEAL INTUBATION  . Cystoscopy with stent placement Left 11/28/2013    Procedure: CYSTOSCOPY WITH STENT PLACEMENT;  Surgeon: Ky BarbanMohammad I Javaid, MD;  Location: [facility] ORS;  Service: Urology;  Laterality: Left;   History reviewed. No pertinent family history. History  Substance Use Topics  . Smoking status: Heavy Tobacco Smoker -- 1.00 packs/day for 25 years    Types: Cigarettes  . Smokeless tobacco: Never Used  . Alcohol Use: No   OB History   Grav Para Term Preterm Abortions TAB SAB Ect Mult Living   3 3 3       3      Review of Systems  All other systems reviewed and are negative.    Allergies  Hydrocodone  Home Medications   Current Outpatient Rx  Name  Route  Sig   Dispense  Refill  . Aspirin-Salicylamide-Caffeine (BC HEADACHE POWDER PO)   Oral   Take 1 packet by mouth daily as needed (headache).         . cephALEXin (KEFLEX) 500 MG capsule   Oral   Take 1 capsule (500 mg total) by mouth 4 (four) times daily.   40 capsule   0   . ondansetron (ZOFRAN) 4 MG tablet   Oral   Take 1 tablet (4 mg total) by mouth every 6 (six) hours.   15 tablet   0   . oxyCODONE (OXY IR/ROXICODONE) 5 MG immediate release tablet   Oral   Take 1 tablet (5 mg total) by mouth every 4 (four) hours as needed for moderate pain.   30 tablet   0    BP 153/100  Pulse 71  Temp(Src) 97.5 F (36.4 C) (Axillary)  Ht 5\' 7"  (1.702 m)  Wt 170 lb (77.111 kg)  BMI 26.62 kg/m2  SpO2 100%  LMP 11/22/2013 Physical Exam  Nursing note and vitals reviewed. Constitutional: She is oriented to person, place, and time. She appears well-developed and well-nourished. No distress.  HENT:  Head: Normocephalic and atraumatic.  Eyes: EOM are normal.  Neck: Normal range of motion.  Cardiovascular: Normal rate, regular rhythm and normal heart sounds.   Pulmonary/Chest: Effort normal and  breath sounds normal.  Abdominal: Soft. She exhibits no distension. There is no tenderness.  Musculoskeletal: Normal range of motion.  Neurological: She is alert and oriented to person, place, and time.  Skin: Skin is warm and dry.  Psychiatric: She has a normal mood and affect. Judgment normal.    ED Course  Procedures (including critical care time) Labs Review Labs Reviewed  URINALYSIS, ROUTINE W REFLEX MICROSCOPIC - Abnormal; Notable for the following:    APPearance HAZY (*)    Specific Gravity, Urine >1.030 (*)    Hgb urine dipstick LARGE (*)    Bilirubin Urine SMALL (*)    Ketones, ur 15 (*)    Protein, ur 100 (*)    Leukocytes, UA TRACE (*)    All other components within normal limits  URINE MICROSCOPIC-ADD ON - Abnormal; Notable for the following:    Bacteria, UA FEW (*)     Crystals CA OXALATE CRYSTALS (*)    All other components within normal limits   Imaging Review Dg Abd 1 View  12/11/2013   CLINICAL DATA:  Flank pain  EXAM: ABDOMEN - 1 VIEW  COMPARISON:  11/28/2013  FINDINGS: Left ureteral stent in stable position. No interval migration of a 9 mm stone overlapping the left kidney. No evidence of new calcification along the left ureteral stent - when accounting for pelvic phleboliths. Nonobstructive bowel gas pattern.  IMPRESSION: Stable positioning of left ureteral stent. No interval stone migration suspected.   Electronically Signed   By: Tiburcio Pea M.D.   On: 12/11/2013 05:05    EKG Interpretation   None       MDM   1. Ureteral colic    5:14 AM Patient feels much better at this time.  Discharge home in good condition.  Likely ureteral colic from stent.  Urology followup.  No signs of urinary tract infection.  Well-appearing.  Discharge home in good condition.    Lyanne Co, MD 12/11/13 [phone]

## 2013-12-11 NOTE — ED Notes (Signed)
Pt reporting pain in left flank.  Reports having a stent in left kidney. Reports she has a 7cm stone left kidney.

## 2013-12-17 ENCOUNTER — Emergency Department (HOSPITAL_COMMUNITY): Payer: Self-pay

## 2013-12-17 ENCOUNTER — Encounter (HOSPITAL_COMMUNITY): Payer: Self-pay | Admitting: Emergency Medicine

## 2013-12-17 ENCOUNTER — Emergency Department (HOSPITAL_COMMUNITY)
Admission: EM | Admit: 2013-12-17 | Discharge: 2013-12-17 | Disposition: A | Discharge status: Home / Self Care | Payer: Self-pay | Attending: Emergency Medicine | Admitting: Emergency Medicine

## 2013-12-17 DIAGNOSIS — N390 Urinary tract infection, site not specified: Secondary | ICD-10-CM | POA: Insufficient documentation

## 2013-12-17 DIAGNOSIS — N200 Calculus of kidney: Secondary | ICD-10-CM | POA: Insufficient documentation

## 2013-12-17 DIAGNOSIS — G8929 Other chronic pain: Secondary | ICD-10-CM | POA: Insufficient documentation

## 2013-12-17 DIAGNOSIS — F17200 Nicotine dependence, unspecified, uncomplicated: Secondary | ICD-10-CM | POA: Insufficient documentation

## 2013-12-17 LAB — URINALYSIS, ROUTINE W REFLEX MICROSCOPIC
Bilirubin Urine: NEGATIVE
Glucose, UA: NEGATIVE mg/dL
Ketones, ur: NEGATIVE mg/dL
Nitrite: NEGATIVE
PH: 6.5 (ref 5.0–8.0)
PROTEIN: 100 mg/dL — AB
Specific Gravity, Urine: 1.025 (ref 1.005–1.030)
Urobilinogen, UA: 0.2 mg/dL (ref 0.0–1.0)

## 2013-12-17 LAB — URINE MICROSCOPIC-ADD ON

## 2013-12-17 MED ORDER — PROMETHAZINE HCL 25 MG PO TABS: 25.0000 mg | ORAL_TABLET | Freq: Four times a day (QID) | ORAL | Status: DC | PRN

## 2013-12-17 MED ORDER — HYDROMORPHONE HCL PF 1 MG/ML IJ SOLN
1.0000 mg | Freq: Once | INTRAMUSCULAR | Status: AC
  Administered 2013-12-17: 1 mg via INTRAVENOUS
  Filled 2013-12-17: qty 1

## 2013-12-17 MED ORDER — CIPROFLOXACIN HCL 500 MG PO TABS: 500.0000 mg | ORAL_TABLET | Freq: Two times a day (BID) | ORAL | Status: DC

## 2013-12-17 MED ORDER — OXYCODONE-ACETAMINOPHEN 5-325 MG PO TABS
2.0000 | ORAL_TABLET | Freq: Once | ORAL | Status: AC
  Administered 2013-12-17: 2 via ORAL
  Filled 2013-12-17: qty 2

## 2013-12-17 MED ORDER — ONDANSETRON 8 MG PO TBDP
8.0000 mg | ORAL_TABLET | Freq: Once | ORAL | Status: AC
  Administered 2013-12-17: 8 mg via ORAL
  Filled 2013-12-17: qty 1

## 2013-12-17 MED ORDER — OXYCODONE-ACETAMINOPHEN 5-325 MG PO TABS: 2.0000 | ORAL_TABLET | ORAL | Status: DC | PRN

## 2013-12-17 MED ORDER — DEXTROSE 5 % IV SOLN
1.0000 g | Freq: Once | INTRAVENOUS | Status: AC
  Administered 2013-12-17: 1 g via INTRAVENOUS
  Filled 2013-12-17: qty 10

## 2013-12-17 NOTE — ED Notes (Signed)
Patient returned from CT

## 2013-12-17 NOTE — Discharge Instructions (Signed)
You have a urinary tract infection.  Prescriptions for antibiotics, pain medicine, nausea medicine. Followup with urologist early in the week

## 2013-12-17 NOTE — ED Provider Notes (Signed)
CSN: [id number]     Arrival date & time 12/17/13  1138 History  This chart was scribed for Donnetta Hutching, MD by Bennett Scrape, ED Scribe. This patient was seen in room APA12/APA12 and the patient's care was started at 1:39 PM.   Chief Complaint  Patient presents with  . Nephrolithiasis   The history is provided by the patient. No language interpreter was used.    HPI Comments: SHACONDA HAJDUK is a 43 y.o. female who presents to the Emergency Department complaining of worsening of ongoing LLQ with radiation into the left lower suprapubic area that started last night with associated hematuria. She admits that the pain has been ongoing "for a while" attributed to a 7 mm kidney stone diagnosed in June/July 2014 but became severe last night when she ran out of her pain medication. Since running out of her pain medication she has been taking BC powders, last dose around 8:30 AM this morning, with minimal relief. Pt had a stent in the left kidney placed 3 weeks ago but states that stone moved back into the kidney. She had an appointment with Dr. Paulino Door as a follow up 2 days ago but states that there was an emergency surgery and her appointment was cancelled. She reports that the ultimate plan is a lithotripsy which was to be scheduled during her appointment 2 days ago. She denies any other complaints currently.  Past Medical History  Diagnosis Date  . Chronic right shoulder pain   . Complication of anesthesia     ' I have a hard time breathing after surgery"  . Kidney stone   . Kidney stone    Past Surgical History  Procedure Laterality Date  . Tubal ligation    . Ankle fracture surgery    . Fracture surgery    . Orif mandibular fracture Bilateral 10/07/2013    Procedure: BILATERAL ORIF MANDIBULAR FRACTURE INTERMAXILLARY FIXATION/EXTRACTION OF TOOTH;  Surgeon: Glenna Fellows, MD;  Location: [facility] OR;  Service: Plastics;  Laterality: Bilateral;  ANESTHESIA: NASO TRACHEAL INTUBATION  . Cystoscopy with  stent placement Left 11/28/2013    Procedure: CYSTOSCOPY WITH STENT PLACEMENT;  Surgeon: Ky Barban, MD;  Location: [facility];  Service: Urology;  Laterality: Left;   History reviewed. No pertinent family history. History  Substance Use Topics  . Smoking status: Heavy Tobacco Smoker -- 1.00 packs/day for 25 years    Types: Cigarettes  . Smokeless tobacco: Never Used  . Alcohol Use: No   OB History   Grav Para Term Preterm Abortions TAB SAB Ect Mult Living   3 3 3       3      Review of Systems  A complete 10 system review of systems was obtained and all systems are negative except as noted in the HPI and PMH.   Allergies  Hydrocodone- causes pruritis   Home Medications   Current Outpatient Rx  Name  Route  Sig  Dispense  Refill  . Aspirin-Salicylamide-Caffeine (BC HEADACHE POWDER PO)   Oral   Take 1 packet by mouth daily as needed (headache).         Marland Kitchen ibuprofen (ADVIL,MOTRIN) 200 MG tablet   Oral   Take 400 mg by mouth every 8 (eight) hours as needed for moderate pain.         . ciprofloxacin (CIPRO) 500 MG tablet   Oral   Take 1 tablet (500 mg total) by mouth 2 (two) times daily.   20 tablet  0   . oxyCODONE-acetaminophen (PERCOCET) 5-325 MG per tablet   Oral   Take 2 tablets by mouth every 4 (four) hours as needed.   30 tablet   0   . promethazine (PHENERGAN) 25 MG tablet   Oral   Take 1 tablet (25 mg total) by mouth every 6 (six) hours as needed for nausea or vomiting.   20 tablet   0    Triage Vitals: BP 173/107  Pulse 91  Temp(Src) 98.1 F (36.7 C) (Oral)  Resp 20  Ht 5\' 7"  (1.702 m)  Wt 180 lb (81.647 kg)  BMI 28.19 kg/m2  SpO2 100%  LMP 11/22/2013  Physical Exam  Nursing note and vitals reviewed. Constitutional: She is oriented to person, place, and time. She appears well-developed and well-nourished.  HENT:  Head: Normocephalic and atraumatic.  Eyes: Conjunctivae and EOM are normal. Pupils are equal, round, and reactive to light.   Neck: Normal range of motion. Neck supple.  Cardiovascular: Normal rate, regular rhythm and normal heart sounds.   Pulmonary/Chest: Effort normal and breath sounds normal.  Abdominal: Soft. Bowel sounds are normal. There is tenderness (minimal LLQ and suprapubic area ).  Musculoskeletal: Normal range of motion.  Neurological: She is alert and oriented to person, place, and time.  Skin: Skin is warm and dry.  Psychiatric: She has a normal mood and affect. Her behavior is normal.    ED Course  Procedures (including critical care time)  Medications  oxyCODONE-acetaminophen (PERCOCET/ROXICET) 5-325 MG per tablet 2 tablet (2 tablets Oral Given 12/17/13 1403)  ondansetron (ZOFRAN-ODT) disintegrating tablet 8 mg (8 mg Oral Given 12/17/13 1403)  cefTRIAXone (ROCEPHIN) 1 g in dextrose 5 % 50 mL IVPB (0 g Intravenous Stopped 12/17/13 1505)  HYDROmorphone (DILAUDID) injection 1 mg (1 mg Intravenous Given 12/17/13 1617)    DIAGNOSTIC STUDIES: Oxygen Saturation is 100% on RA, normal by my interpretation.    COORDINATION OF CARE: 1:45 PM-Discussed treatment plan which includes KUB, pain management and urology consult with pt at bedside and pt agreed to plan.   Labs Review Labs Reviewed  URINALYSIS, ROUTINE W REFLEX MICROSCOPIC - Abnormal; Notable for the following:    Color, Urine AMBER (*)    APPearance CLOUDY (*)    Hgb urine dipstick LARGE (*)    Protein, ur 100 (*)    Leukocytes, UA SMALL (*)    All other components within normal limits  URINE MICROSCOPIC-ADD ON - Abnormal; Notable for the following:    Squamous Epithelial / LPF MANY (*)    Bacteria, UA FEW (*)    All other components within normal limits  URINE CULTURE   Imaging Review Dg Abd 1 View  12/17/2013   CLINICAL DATA:  Left-sided flank pain, hematuria and history of renal calculi and ureteral stent placement.  EXAM: ABDOMEN - 1 VIEW  COMPARISON:  12/11/2013  FINDINGS: Left ureteral stent positioning is stable. Calculus is  again identified overlying the lower pole collecting system. Based on appearance, this calculus may be partially fragmented. Rough diameter is 10 mm. No other calculi are identified.  IMPRESSION: Fragmented appearing calculus overlying the lower pole of the left kidney.   Electronically Signed   By: Irish LackGlenn  Yamagata M.D.   On: 12/17/2013 14:25    EKG Interpretation   None       MDM   1. Kidney stone on left side   2. UTI (lower urinary tract infection)    Patient has a known left-sided kidney stone with  stent placement.   She feels better after pain medicine. Urinalysis shows evidence of infection. Discharge medications include Percocet, Phenergan 25 mg, Cipro 500 mg. Followup with urologist early in week.  I personally performed the services described in this documentation, which was scribed in my presence. The recorded information has been reviewed and is accurate.    Donnetta Hutching, MD 12/17/13 [id number]

## 2013-12-17 NOTE — ED Notes (Signed)
Pt diagnosed with 7 mm kidney stone 6 months ago, ureter stent placed, stone moved back in to kidney. Has been having left sided lower back pain that radiates to abdomen, unable to see PCP this week. Took BC powder [phone] with mild relief.

## 2013-12-17 NOTE — ED Notes (Signed)
Patient states she went to see her PCP last Thursday. She states, "I was diagnosed with a 7 mm stone that is too large to pass and I am supposed to schedule an appointment for lithotripsy this coming week. I am schedule to see my PCP again on Tuesday. I was given a prescription for Percocet 7.5/325 that was helping, but I ran out and I cannot wait till Tuesday." Pain reports right sided back pain that radiates to her abdomen.

## 2013-12-18 LAB — URINE CULTURE: Colony Count: 45000

## 2013-12-19 ENCOUNTER — Other Ambulatory Visit (HOSPITAL_COMMUNITY): Payer: Self-pay | Admitting: Urology

## 2013-12-19 ENCOUNTER — Ambulatory Visit (HOSPITAL_COMMUNITY)
Admission: RE | Admit: 2013-12-19 | Discharge: 2013-12-19 | Disposition: A | Discharge status: Home / Self Care | Payer: Self-pay | Source: Ambulatory Visit | Attending: Urology | Admitting: Urology

## 2013-12-19 DIAGNOSIS — N200 Calculus of kidney: Secondary | ICD-10-CM | POA: Insufficient documentation

## 2013-12-29 ENCOUNTER — Emergency Department (HOSPITAL_COMMUNITY)
Admission: EM | Admit: 2013-12-29 | Discharge: 2013-12-29 | Disposition: A | Discharge status: Home / Self Care | Payer: Self-pay

## 2013-12-29 NOTE — ED Notes (Signed)
Pt not in waiting room

## 2014-01-06 ENCOUNTER — Encounter (HOSPITAL_COMMUNITY): Payer: Self-pay

## 2014-01-06 ENCOUNTER — Encounter (HOSPITAL_COMMUNITY)
Admission: RE | Admit: 2014-01-06 | Discharge: 2014-01-06 | Disposition: A | Discharge status: Home / Self Care | Payer: Self-pay | Source: Ambulatory Visit | Attending: Urology | Admitting: Urology

## 2014-01-06 ENCOUNTER — Encounter (HOSPITAL_COMMUNITY): Payer: Self-pay | Admitting: Pharmacy Technician

## 2014-01-06 MED ORDER — DIPHENHYDRAMINE HCL 25 MG PO CAPS: 25.0000 mg | ORAL_CAPSULE | Freq: Once | ORAL | Status: DC

## 2014-01-06 NOTE — Patient Instructions (Signed)
Elaine Morgan  01/06/2014   Your procedure is scheduled on:  01/11/2014  Report to [facility] at  1230  PM.  Call this number if you have problems the morning of surgery: [phone]   Remember:   Do not eat food or drink liquids after midnight.   Take these medicines the morning of surgery with A SIP OF WATER:  Oxycodone, phenergan   Do not wear jewelry, make-up or nail polish.  Do not wear lotions, powders, or perfumes.   Do not shave 48 hours prior to surgery. Men may shave face and neck.  Do not bring valuables to the hospital.  [facility] is not responsible  for any belongings or valuables.               Contacts, dentures or bridgework may not be worn into surgery.  Leave suitcase in the car. After surgery it may be brought to your room.  For patients admitted to the hospital, discharge time is determined by your treatment team.               Patients discharged the day of surgery will not be allowed to drive home.  Name and phone number of your driver: family  Special Instructions: N/A   Please read over the following fact sheets that you were given: Pain Booklet, Coughing and Deep Breathing, Surgical Site Infection Prevention, Anesthesia Post-op Instructions and Care and Recovery After Surgery Lithotripsy for Kidney Stones Lithotripsy is a treatment that can sometimes help eliminate kidney stones and pain that they cause. A form of lithotripsy, also known as extracorporeal shock wave lithotripsy, is a nonsurgical procedure that helps your body rid itself of the kidney stone when it is too big to pass on its own. Extracorporeal shock wave lithotripsy is a method of crushing a kidney stone with shock waves. These shock waves pass through your body and are focused on your stone. They cause the kidney stones to crumble while still in the urinary tract. It is then easier for the smaller pieces of stone to pass in the urine. Lithotripsy usually takes about an hour. It is done in  a hospital, a lithotripsy center, or a mobile unit. It usually does not require an overnight stay. Your health care provider will instruct you on preparation for the procedure. Your health care provider will tell you what to expect afterward. LET [facility] CARE PROVIDER KNOW ABOUT:  Any allergies you have.  All medicines you are taking, including vitamins, herbs, eye drops, creams, and over-the-counter medicines.  Previous problems you or members of your family have had with the use of anesthetics.  Any blood disorders you have.  Previous surgeries you have had.  Medical conditions you have. RISKS AND COMPLICATIONS Generally, lithotripsy for kidney stones is a safe procedure. However, as with any procedure, complications can occur. Possible complications include:  Infection.  Bleeding of the kidney.  Bruising of the kidney or skin.  Obstruction of the ureter.  Failure of the stone to fragment. BEFORE THE PROCEDURE  Do not eat or drink for 6 8 hours prior to the procedure. You may, however, take the medications with a sip of water that your physician instructs you to take  Do not take aspirin or aspirin-containing products for 7 days prior to your procedure  Do not take nonsteroidal anti-inflammatory products for 7 days prior to your procedure PROCEDURE A stent (flexible tube with holes) may be placed in your ureter.  The ureter is the tube that transports the urine from the kidneys to the bladder. Your health care provider may place a stent before the procedure. This will help keep urine flowing from the kidney if the fragments of the stone block the ureter. You may have an IV tube placed in one of your veins to give you fluids and medicines. These medicines may help you relax or make you sleep. During the procedure, you will lie comfortably on a fluid-filled cushion or in a warm-water bath. After an X-ray or ultrasound exam to locate your stone, shock waves are aimed at the stone.  If you are awake, you may feel a tapping sensation as the shock waves pass through your body. If large stone particles remain after treatment, a second procedure may be necessary at a later date. For comfort during the test:  Relax as much as possible.  Try to remain still as much as possible.  Try to follow instructions to speed up the test.  Let your health care provider know if you are uncomfortable, anxious, or in pain. AFTER THE PROCEDURE  After surgery, you will be taken to the recovery area. A nurse will watch and check your progress. Once you're awake, stable, and taking fluids well, you will be allowed to go home as long as there are no problems. You will also be allowed to pass your urine before discharge.You may be given antibiotics to help prevent infection. You may also be prescribed pain medicine if needed. In a week or two, your health care provider may remove your stent, if you have one. You may first have an X-ray exam to check on how successful the fragmentation of your stone has been and how much of the stone has passed. Your health care provider will check to see whether or not stone particles remain. SEEK IMMEDIATE MEDICAL CARE IF:  You develop a fever or shaking chills.  Your pain is not relieved by medicine.  You feel sick to your stomach (nauseated) and you vomit.  You develop heavy bleeding.  You have difficulty urinating.  You start to pass your stent from your penis. Document Released: 11/21/2000 Document Revised: 09/14/2013 Document Reviewed: 06/09/2013 [facility] Patient Information 2014 [place], [place]. PATIENT INSTRUCTIONS POST-ANESTHESIA  IMMEDIATELY FOLLOWING SURGERY:  Do not drive or operate machinery for the first twenty four hours after surgery.  Do not make any important decisions for twenty four hours after surgery or while taking narcotic pain medications or sedatives.  If you develop intractable nausea and vomiting or a severe headache please  notify your doctor immediately.  FOLLOW-UP:  Please make an appointment with your surgeon as instructed. You do not need to follow up with anesthesia unless specifically instructed to do so.  WOUND CARE INSTRUCTIONS (if applicable):  Keep a dry clean dressing on the anesthesia/puncture wound site if there is drainage.  Once the wound has quit draining you may leave it open to air.  Generally you should leave the bandage intact for twenty four hours unless there is drainage.  If the epidural site drains for more than 36-48 hours please call the anesthesia department.  QUESTIONS?:  Please feel free to call your physician or the hospital operator if you have any questions, and they will be happy to assist you.

## 2014-01-10 NOTE — H&P (Signed)
Urology Admission H&P  Chief Complaint: left renal colic, 1 cm stone lt kidney; double J stent was placed 11/28/13; referred here to have ESL,but did not come back. Decided to have lithotripsy. She wanted the stent out.Now she is requesting lithotripsy.Coming tomorrow to have ESL left kidney. Additional procedure has been discussed with her.  History of Present Illness: Recurrent lt renal colic.  Past Medical History  Diagnosis Date  . Chronic right shoulder pain   . Complication of anesthesia     ' I have a hard time breathing after surgery"  . Kidney stone   . Kidney stone    Past Surgical History  Procedure Laterality Date  . Tubal ligation    . Ankle fracture surgery Left   . Fracture surgery    . Orif mandibular fracture Bilateral 10/07/2013    Procedure: BILATERAL ORIF MANDIBULAR FRACTURE INTERMAXILLARY FIXATION/EXTRACTION OF TOOTH;  Surgeon: Glenna FellowsBrinda Thimmappa, MD;  Location: [facility] OR;  Service: Plastics;  Laterality: Bilateral;  ANESTHESIA: NASO TRACHEAL INTUBATION  . Cystoscopy with stent placement Left 11/28/2013    Procedure: CYSTOSCOPY WITH STENT PLACEMENT;  Surgeon: Ky BarbanMohammad I Kolbe Delmonaco, MD;  Location: [facility];  Service: Urology;  Laterality: Left;    Home Medications:  No prescriptions prior to admission   Allergies:  Allergies  Allergen Reactions  . Hydrocodone Itching and Nausea And Vomiting    No family history on file. Social History:  reports that she has been smoking Cigarettes.  She has a 25 pack-year smoking history. She has never used smokeless tobacco. She reports that she does not drink alcohol or use illicit drugs.  Review of Systems  Genitourinary: Positive for flank pain.    Physical Exam:  Vital signs in last 24 hours:   Physical Exam  Constitutional: She appears well-developed.  Cardiovascular: Normal rate.   GI: There is tenderness.  Musculoskeletal: Normal range of motion.    Laboratory Data:  No results found for this or any previous visit  (from the past 24 hour(s)). No results found for this or any previous visit (from the past 240 hour(s)). Creatinine: No results found for this basename: CREATININE,  in the last 168 hours Baseline Creatinine:   Impression/Assessment:  Lt renal calculus  Plan:  ESL lt renal calculs Mischa Pollard,MOHAMMAD I 01/10/2014, 3:23 PM

## 2014-01-11 ENCOUNTER — Ambulatory Visit (HOSPITAL_COMMUNITY)
Admission: RE | Admit: 2014-01-11 | Discharge: 2014-01-11 | Disposition: A | Discharge status: Home / Self Care | Payer: Self-pay | Source: Ambulatory Visit | Attending: Urology | Admitting: Urology

## 2014-01-11 ENCOUNTER — Encounter (HOSPITAL_COMMUNITY)
Admission: RE | Disposition: A | Discharge status: Home / Self Care | Payer: Self-pay | Source: Ambulatory Visit | Attending: Urology

## 2014-01-11 ENCOUNTER — Ambulatory Visit (HOSPITAL_COMMUNITY): Payer: Self-pay

## 2014-01-11 ENCOUNTER — Encounter (HOSPITAL_COMMUNITY): Payer: Self-pay | Admitting: *Deleted

## 2014-01-11 DIAGNOSIS — N200 Calculus of kidney: Secondary | ICD-10-CM | POA: Insufficient documentation

## 2014-01-11 DIAGNOSIS — F17200 Nicotine dependence, unspecified, uncomplicated: Secondary | ICD-10-CM | POA: Insufficient documentation

## 2014-01-11 HISTORY — PX: EXTRACORPOREAL SHOCK WAVE LITHOTRIPSY: SHX1557

## 2014-01-11 SURGERY — LITHOTRIPSY, ESWL
Anesthesia: Moderate Sedation | Laterality: Left

## 2014-01-11 MED ORDER — DIPHENHYDRAMINE HCL 25 MG PO CAPS
25.0000 mg | ORAL_CAPSULE | Freq: Once | ORAL | Status: AC
  Administered 2014-01-11: 25 mg via ORAL
  Filled 2014-01-11: qty 1

## 2014-01-11 MED ORDER — DIAZEPAM 5 MG PO TABS
10.0000 mg | ORAL_TABLET | Freq: Once | ORAL | Status: AC
  Administered 2014-01-11: 10 mg via ORAL
  Filled 2014-01-11: qty 2

## 2014-01-11 MED ORDER — SODIUM CHLORIDE 0.9 % IV SOLN
INTRAVENOUS | Status: DC
  Administered 2014-01-11: 14:00:00 via INTRAVENOUS

## 2014-01-11 SURGICAL SUPPLY — 3 items
CLOTH BEACON ORANGE TIMEOUT ST (SAFETY) IMPLANT
GOWN STRL REUS W/TWL LRG LVL3 (GOWN DISPOSABLE) IMPLANT
TOWEL OR 17X26 4PK STRL BLUE (TOWEL DISPOSABLE) IMPLANT

## 2014-01-11 NOTE — Discharge Instructions (Signed)
Lithotripsy, Care After °Refer to this sheet in the next few weeks. These instructions provide you with information on caring for yourself after your procedure. Your health care provider may also give you more specific instructions. Your treatment has been planned according to current medical practices, but problems sometimes occur. Call your health care provider if you have any problems or questions after your procedure. °WHAT TO EXPECT AFTER THE PROCEDURE  °· Your urine may have a red tinge for a few days after treatment. Blood loss is usually minimal. °· You may have soreness in the back or flank area. This usually goes away after a few days. The procedure can cause blotches or bruises on the back where the pressure wave enters the skin. These marks usually cause only minimal discomfort and should disappear in a short time. °· Stone fragments should begin to pass within 24 hours of treatment. However, a delayed passage is not unusual. °· You may have pain, discomfort, and feel sick to your stomach (nauseated) when the crushed fragments of stone are passed down the tube from the kidney to the bladder. Stone fragments can pass soon after the procedure and may last for up to 4 8 weeks. °· A small number of patients may have severe pain when stone fragments are not able to pass, which leads to an obstruction. °· If your stone is greater than 1 inch (2.5 cm) in diameter or if you have multiple stones that have a combined diameter greater than 1 inch (2.5 cm), you may require more than one treatment. °· If you had a stent placed prior to your procedure, you may experience some discomfort, especially during urination. You may experience the pain or discomfort in your flank or back, or you may experience a sharp pain or discomfort at the base of your penis or in your lower abdomen. The discomfort usually lasts only a few minutes after urinating. °HOME CARE INSTRUCTIONS  °· Rest at home until you feel your energy  improving. °· Only take over-the-counter or prescription medicines for pain, discomfort, or fever as directed by your health care provider. Depending on the type of lithotripsy, you may need to take antibiotics and anti-inflammatory medicines for a few days. °· Drink enough water and fluids to keep your urine clear or pale yellow. This helps "flush" your kidneys. It helps pass any remaining pieces of stone and prevents stones from coming back. °· Most people can resume daily activities within 1 2 days after standard lithotripsy. It can take longer to recover from laser and percutaneous lithotripsy. °· If the stones are in your urinary system, you may be asked to strain your urine at home to look for stones. Any stones that are found can be sent to a medical lab for examination. °· Visit your health care provider for a follow-up appointment in a few weeks. Your doctor may remove your stent if you have one. Your health care provider will also check to see whether stone particles still remain. °SEEK MEDICAL CARE IF:  °· Your pain is not relieved by medicine. °· You have a lasting nauseous feeling. °· You feel there is too much blood in the urine. °· You develop persistent problems with frequent or painful urination that does not at least partially improve after 2 days following the procedure. °· You have a congested cough. °· You feel lightheaded. °· You develop a rash or any other signs that might suggest an allergic problem. °· You develop any reaction or side   effects to your medicine(s). °SEEK IMMEDIATE MEDICAL CARE IF:  °· You experience severe back or flank pain or both. °· You see nothing but blood when you urinate. °· You cannot pass any urine at all. °· You have a fever or shaking chills. °· You develop shortness of breath, difficulty breathing, or chest pain. °· You develop vomiting that will not stop after 6 8 hours. °· You have a fainting episode. °Document Released: 12/14/2007 Document Revised: 09/14/2013  Document Reviewed: 06/09/2013 °Exit[employer]® Patient Information ©2014 [employer]. ° °

## 2014-01-12 ENCOUNTER — Encounter (HOSPITAL_COMMUNITY): Payer: Self-pay | Admitting: Urology

## 2014-01-16 MED FILL — Midazolam HCl Inj 2 MG/2ML (Base Equivalent): INTRAMUSCULAR | Qty: 8 | Status: AC

## 2014-01-16 MED FILL — Ondansetron HCl Inj 4 MG/2ML (2 MG/ML): INTRAMUSCULAR | Qty: 2 | Status: AC

## 2014-01-16 MED FILL — Fentanyl Citrate Inj 0.05 MG/ML: INTRAMUSCULAR | Qty: 8 | Status: AC

## 2014-01-16 MED FILL — Diphenhydramine HCl Inj 50 MG/ML: INTRAMUSCULAR | Qty: 1 | Status: AC

## 2014-01-18 ENCOUNTER — Other Ambulatory Visit (HOSPITAL_COMMUNITY): Payer: Self-pay | Admitting: Urology

## 2014-01-18 ENCOUNTER — Ambulatory Visit (HOSPITAL_COMMUNITY)
Admission: RE | Admit: 2014-01-18 | Discharge: 2014-01-18 | Disposition: A | Discharge status: Home / Self Care | Payer: Self-pay | Source: Ambulatory Visit | Attending: Urology | Admitting: Urology

## 2014-01-18 DIAGNOSIS — N200 Calculus of kidney: Secondary | ICD-10-CM | POA: Insufficient documentation

## 2014-01-18 DIAGNOSIS — Z09 Encounter for follow-up examination after completed treatment for conditions other than malignant neoplasm: Secondary | ICD-10-CM | POA: Insufficient documentation

## 2014-05-13 ENCOUNTER — Encounter (HOSPITAL_COMMUNITY): Payer: Self-pay | Admitting: Emergency Medicine

## 2014-05-13 ENCOUNTER — Emergency Department (HOSPITAL_COMMUNITY)
Admission: EM | Admit: 2014-05-13 | Discharge: 2014-05-13 | Disposition: A | Discharge status: Home / Self Care | Payer: Self-pay | Attending: Emergency Medicine | Admitting: Emergency Medicine

## 2014-05-13 DIAGNOSIS — K089 Disorder of teeth and supporting structures, unspecified: Secondary | ICD-10-CM | POA: Insufficient documentation

## 2014-05-13 DIAGNOSIS — Z79899 Other long term (current) drug therapy: Secondary | ICD-10-CM | POA: Insufficient documentation

## 2014-05-13 DIAGNOSIS — G8929 Other chronic pain: Secondary | ICD-10-CM | POA: Insufficient documentation

## 2014-05-13 DIAGNOSIS — Z87442 Personal history of urinary calculi: Secondary | ICD-10-CM | POA: Insufficient documentation

## 2014-05-13 DIAGNOSIS — F17200 Nicotine dependence, unspecified, uncomplicated: Secondary | ICD-10-CM | POA: Insufficient documentation

## 2014-05-13 DIAGNOSIS — Z8781 Personal history of (healed) traumatic fracture: Secondary | ICD-10-CM | POA: Insufficient documentation

## 2014-05-13 MED ORDER — OXYCODONE-ACETAMINOPHEN 5-325 MG PO TABS: 1.0000 | ORAL_TABLET | ORAL | Status: DC | PRN

## 2014-05-13 NOTE — ED Notes (Signed)
Pt states she thinks she has sutures still in her mouth from eight months ago
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
## 2014-05-16 NOTE — ED Provider Notes (Signed)
Medical screening examination/treatment/procedure(s) were performed by non-physician practitioner and as supervising physician I was immediately available for consultation/collaboration.   EKG Interpretation None        Lillieanna Tuohy M Delonna Ney, DO 05/16/14 1202 

## 2014-10-09 ENCOUNTER — Encounter (HOSPITAL_COMMUNITY): Payer: Self-pay | Admitting: Emergency Medicine

## 2015-03-25 IMAGING — CR DG CHEST 2V
2 series · 2 of 2 positions shown · non-contrast
Comparison: None.

CLINICAL DATA: Kidney stones.  Cough and congestion.

EXAM:
CHEST  2 VIEW

[view not recorded (1 of 2)]
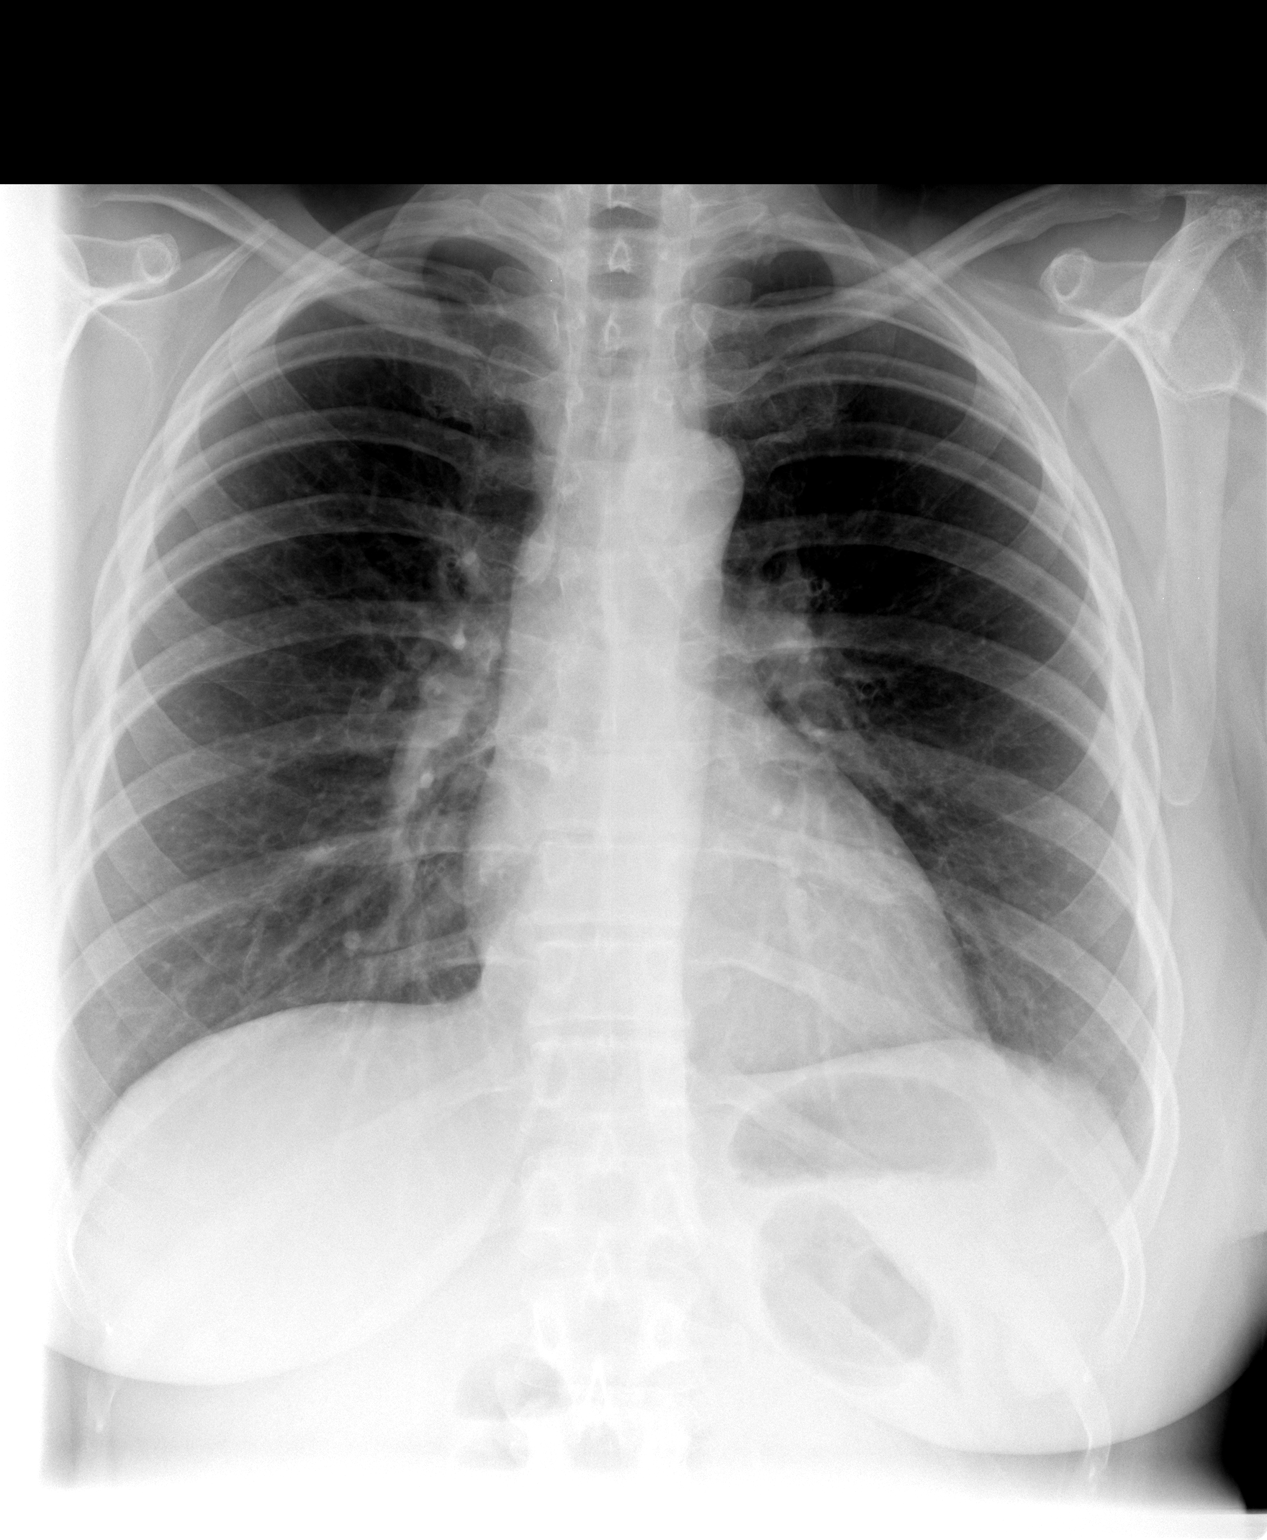

[view not recorded (2 of 2)]
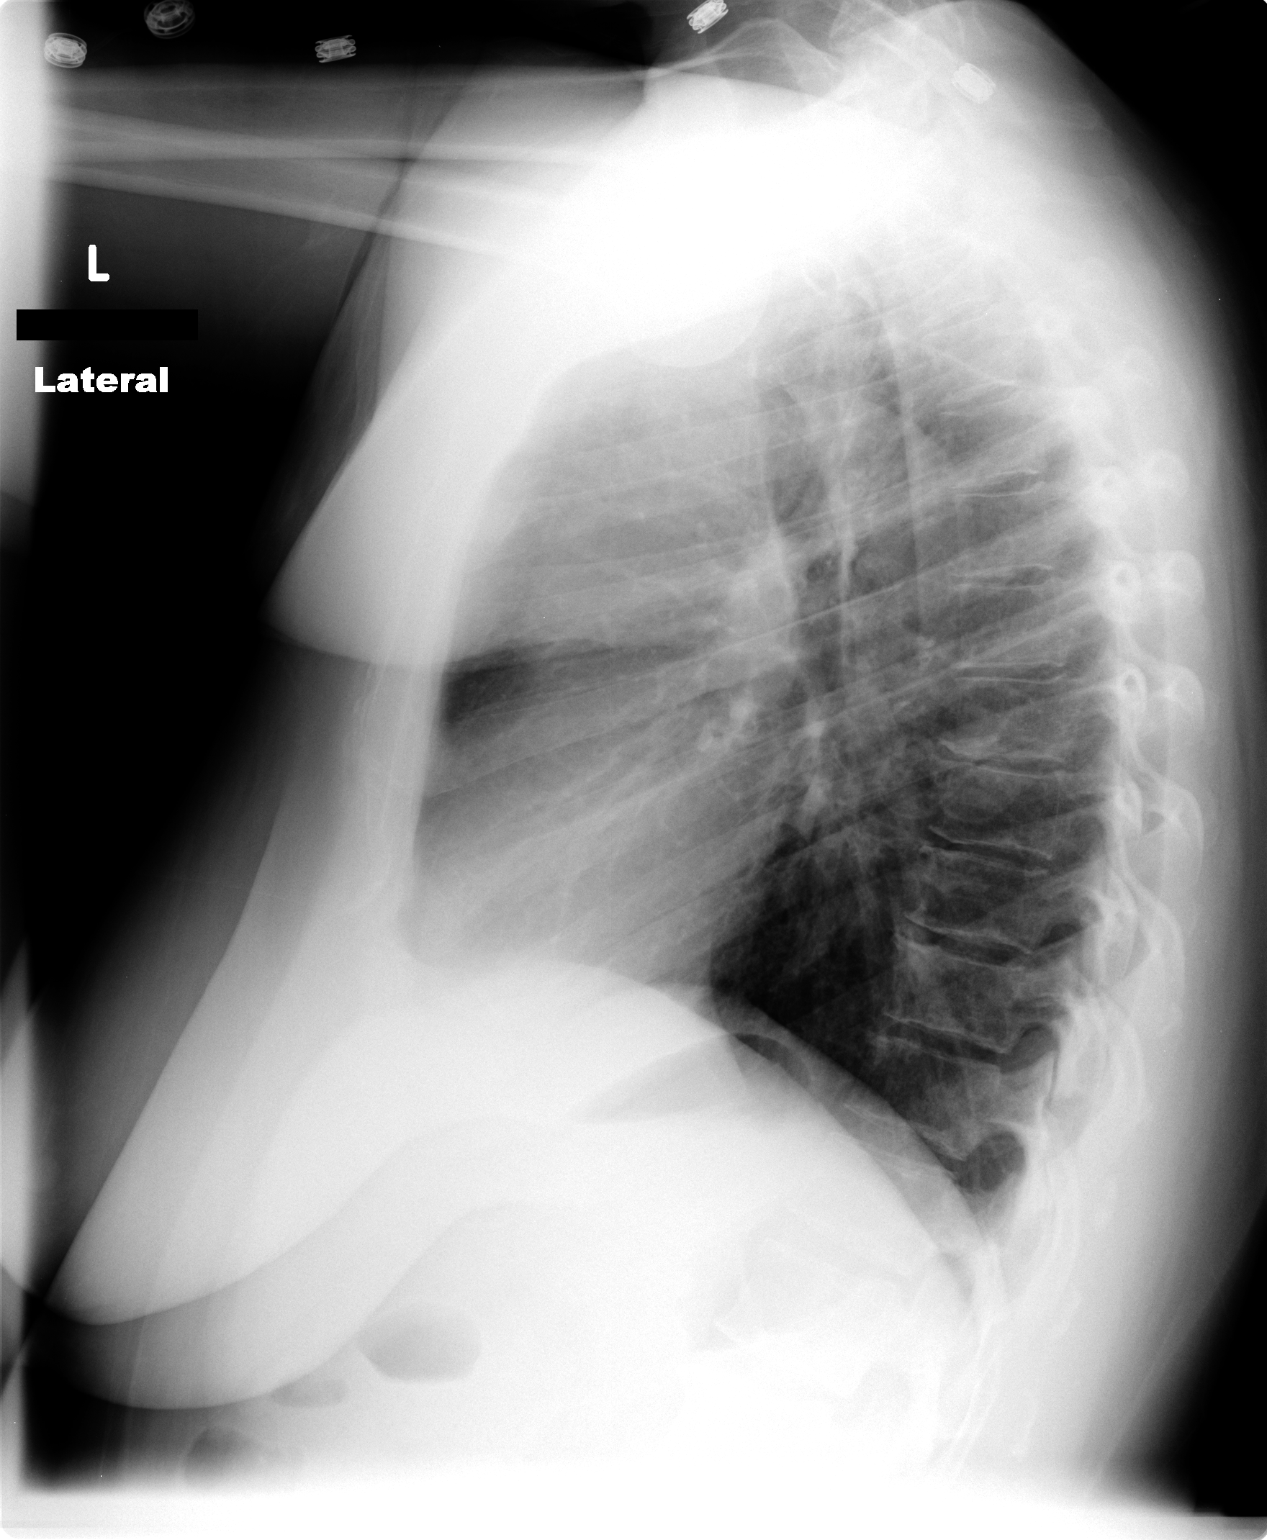

[2 of 2 positions shown; findings below may reference images not displayed]

FINDINGS: The heart size and mediastinal contours are within normal limits.
Both lungs are clear. The visualized skeletal structures are
unremarkable.
IMPRESSION: No active cardiopulmonary disease.

## 2015-03-26 IMAGING — CR DG ABD PORTABLE 1V
1 series · 1 of 1 positions shown · non-contrast
Comparison: CT abdomen and pelvis 11/20/2013 and abdominal
radiographs 04/19/2013

CLINICAL DATA: Postop stent placement.

EXAM:
PORTABLE ABDOMEN - 1 VIEW

[supine ap]
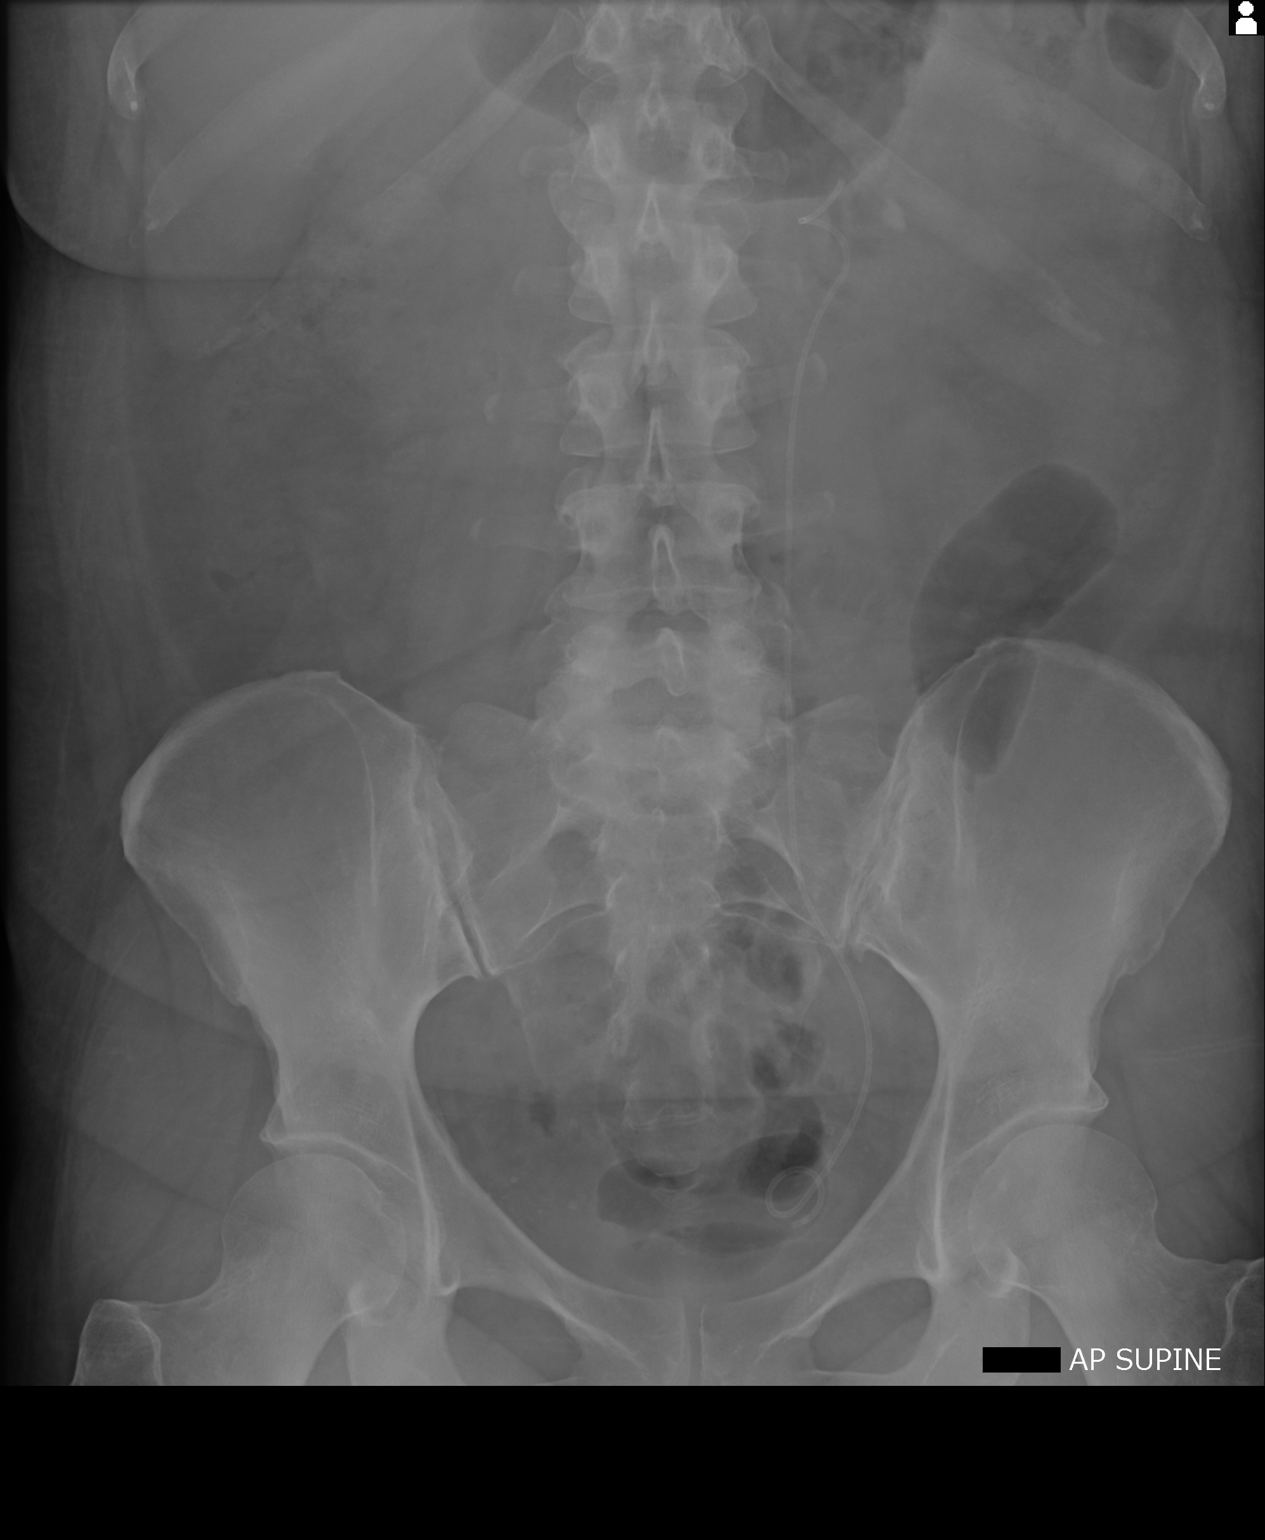

[1 of 1 positions shown; findings below may reference images not displayed]

FINDINGS: There has been interval placement of 8 left double-J ureteral stent.
8 mm ovoid density just lateral to the proximal aspect of the stent
likely corresponds to the UPJ stone described on recent CT. There no
evidence of bowel obstruction. No acute osseous abnormality is seen.
IMPRESSION: Interval placement of left ureteral stent. 8 mm calculus in region
of left UPJ.

## 2015-03-26 IMAGING — RF DG C-ARM 61-120 MIN
1 series · 3 of 3 positions shown · non-contrast
Comparison: CT 11/20/2013

CLINICAL DATA: Stent placement.

EXAM:
DG C-ARM 1-60 MIN

[Series 1: run · 3 of 3 slices shown]
[im 1/3]
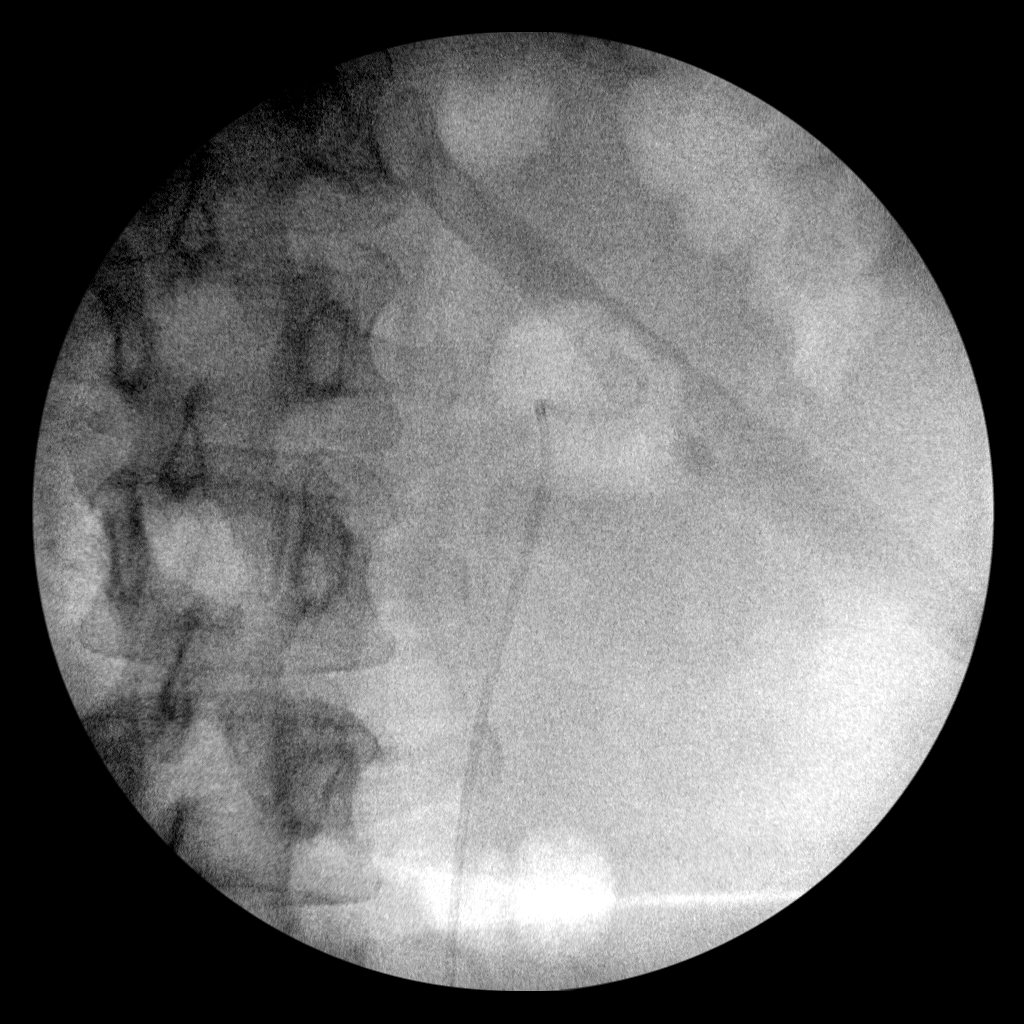
[im 2/3]
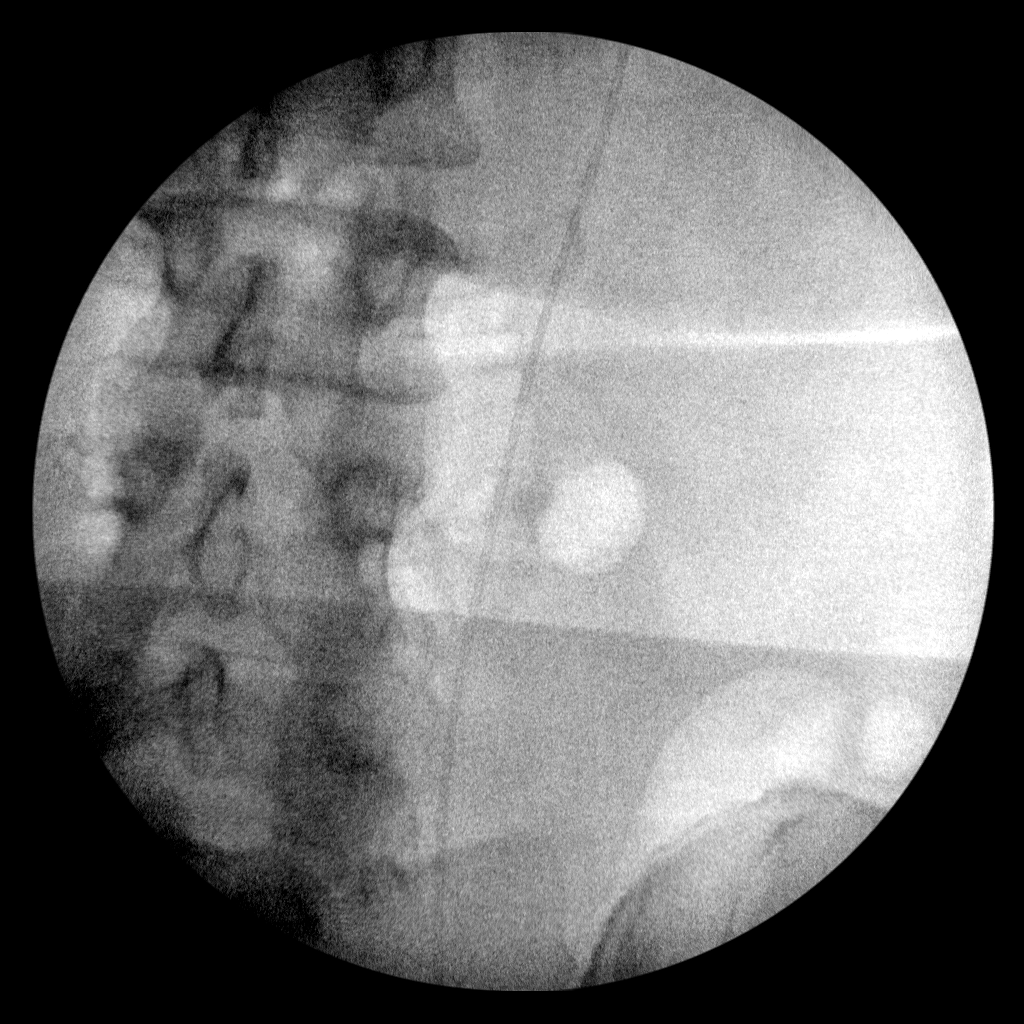
[im 3/3]
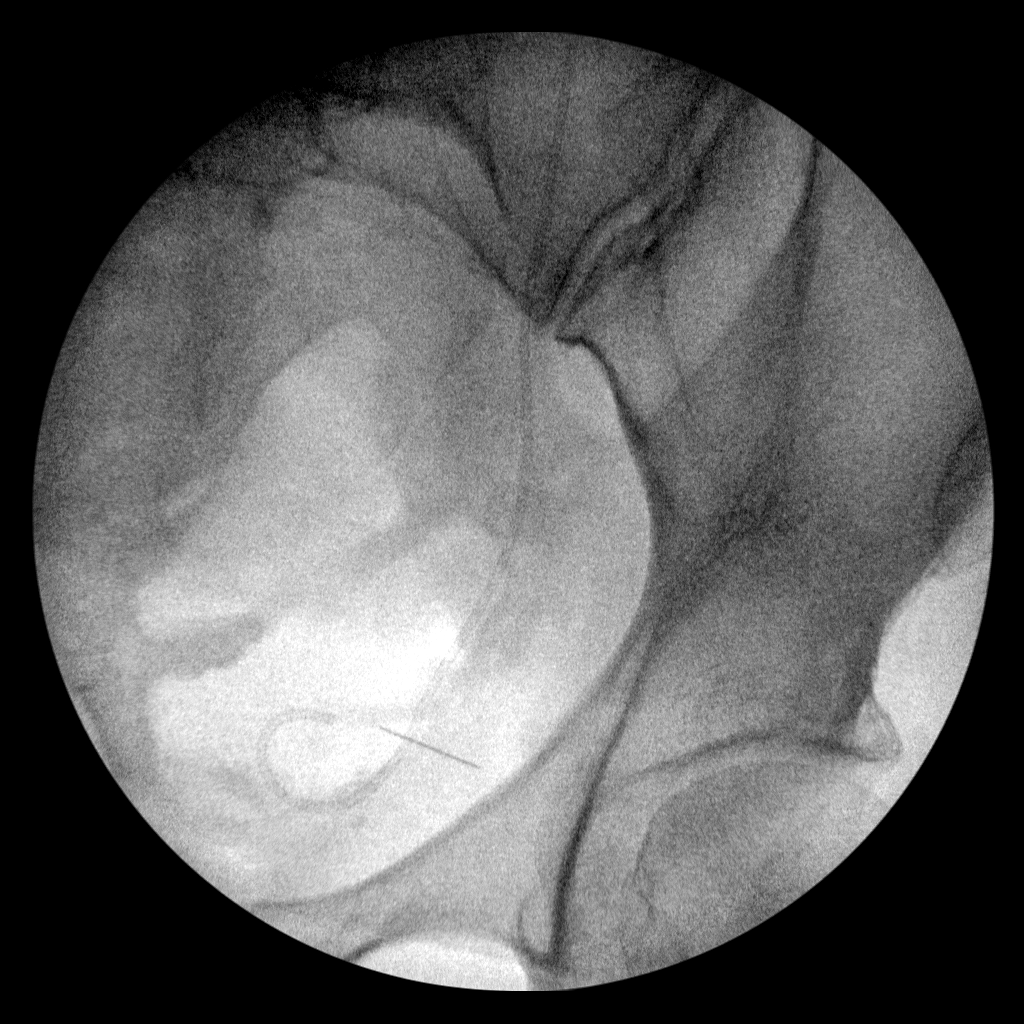

[3 of 3 positions shown; findings below may reference images not displayed]

FINDINGS: Three intraoperative spot images demonstrate placement of a left
ureteral stent. This appears to be in expected position. Possible
migration of the previously seen proximal left ureteral stone into
the left renal lower pole on 1st spot image.
IMPRESSION: Placement of left ureteral stent.

## 2015-04-08 IMAGING — CR DG ABDOMEN 1V
1 series · 1 of 1 positions shown · non-contrast
Comparison: 11/28/2013

CLINICAL DATA: Flank pain

EXAM:
ABDOMEN - 1 VIEW

[view not recorded]
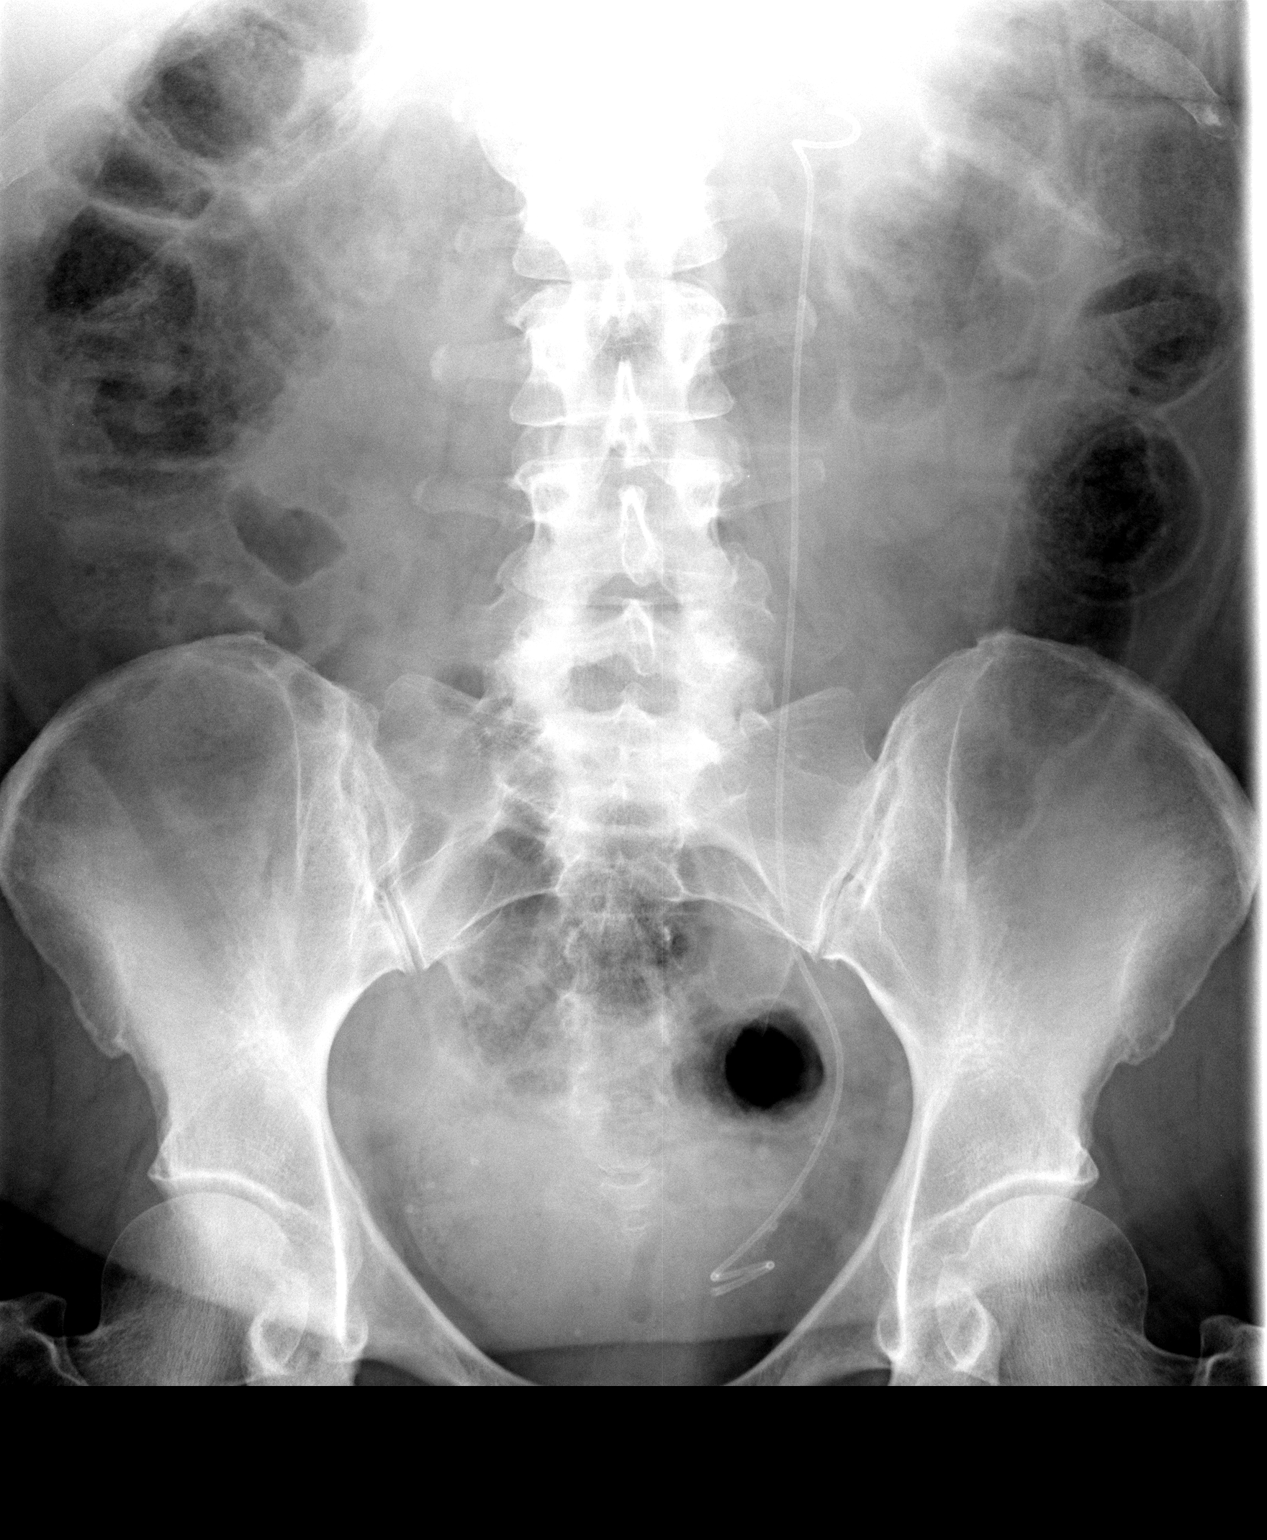

[1 of 1 positions shown; findings below may reference images not displayed]

FINDINGS: Left ureteral stent in stable position. No interval migration of a 9
mm stone overlapping the left kidney. No evidence of new
calcification along the left ureteral stent - when accounting for
pelvic phleboliths. Nonobstructive bowel gas pattern.
IMPRESSION: Stable positioning of left ureteral stent. No interval stone
migration suspected.

## 2015-06-25 ENCOUNTER — Emergency Department (HOSPITAL_COMMUNITY)
Admission: EM | Admit: 2015-06-25 | Discharge: 2015-06-25 | Disposition: A | Discharge status: Home / Self Care | Payer: Self-pay | Attending: Emergency Medicine | Admitting: Emergency Medicine

## 2015-06-25 ENCOUNTER — Encounter (HOSPITAL_COMMUNITY): Payer: Self-pay

## 2015-06-25 DIAGNOSIS — Z9889 Other specified postprocedural states: Secondary | ICD-10-CM | POA: Insufficient documentation

## 2015-06-25 DIAGNOSIS — Z87442 Personal history of urinary calculi: Secondary | ICD-10-CM | POA: Insufficient documentation

## 2015-06-25 DIAGNOSIS — Z8781 Personal history of (healed) traumatic fracture: Secondary | ICD-10-CM | POA: Insufficient documentation

## 2015-06-25 DIAGNOSIS — Z720 Tobacco use: Secondary | ICD-10-CM | POA: Insufficient documentation

## 2015-06-25 DIAGNOSIS — G8921 Chronic pain due to trauma: Secondary | ICD-10-CM | POA: Insufficient documentation

## 2015-06-25 DIAGNOSIS — K0889 Other specified disorders of teeth and supporting structures: Secondary | ICD-10-CM

## 2015-06-25 DIAGNOSIS — K088 Other specified disorders of teeth and supporting structures: Secondary | ICD-10-CM | POA: Insufficient documentation

## 2015-06-25 DIAGNOSIS — K002 Abnormalities of size and form of teeth: Secondary | ICD-10-CM | POA: Insufficient documentation

## 2015-06-25 DIAGNOSIS — K029 Dental caries, unspecified: Secondary | ICD-10-CM | POA: Insufficient documentation

## 2015-06-25 MED ORDER — TRAMADOL HCL 50 MG PO TABS: 50.0000 mg | ORAL_TABLET | Freq: Four times a day (QID) | ORAL | Status: DC | PRN
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
## 2015-06-25 NOTE — Discharge Instructions (Signed)
Dental Pain A tooth ache may be caused by cavities (tooth decay). Cavities expose the nerve of the tooth to air and hot or cold temperatures. It may come from an infection or abscess (also called a boil or furuncle) around your tooth. It is also often caused by dental caries (tooth decay). This causes the pain you are having. DIAGNOSIS  Your caregiver can diagnose this problem by exam. TREATMENT   If caused by an infection, it may be treated with medications which kill germs (antibiotics) and pain medications as prescribed by your caregiver. Take medications as directed.  Only take over-the-counter or prescription medicines for pain, discomfort, or fever as directed by your caregiver.  Whether the tooth ache today is caused by infection or dental disease, you should see your dentist as soon as possible for further care. SEEK MEDICAL CARE IF: The exam and treatment you received today has been provided on an emergency basis only. This is not a substitute for complete medical or dental care. If your problem worsens or new problems (symptoms) appear, and you are unable to meet with your dentist, call or return to this location. SEEK IMMEDIATE MEDICAL CARE IF:   You have a fever.  You develop redness and swelling of your face, jaw, or neck.  You are unable to open your mouth.  You have severe pain uncontrolled by pain medicine. MAKE SURE YOU:   Understand these instructions.  Will watch your condition.  Will get help right away if you are not doing well or get worse. Document Released: 11/24/2005 Document Revised: 02/16/2012 Document Reviewed: 07/12/2008 [facility] Patient Information 2015 [place]ExitCare, [place]LL[place]. This information is not intended to replace advice given to you by your health care provider. Make sure you discuss any questions you have with your health care provider.   You may also try using Ambesol or Orajel which may help with mouth pain.

## 2015-06-25 NOTE — ED Notes (Signed)
Discharge papers given to pt -Discussed pain medication use-- Pt also provided with information concerning , dental clinics/dentist. And [place]
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
## 2017-02-27 ENCOUNTER — Emergency Department (HOSPITAL_COMMUNITY): Payer: Self-pay

## 2017-02-27 ENCOUNTER — Emergency Department (HOSPITAL_COMMUNITY)
Admission: EM | Admit: 2017-02-27 | Discharge: 2017-02-27 | Disposition: A | Discharge status: Home / Self Care | Payer: Self-pay | Attending: Emergency Medicine | Admitting: Emergency Medicine

## 2017-02-27 ENCOUNTER — Encounter (HOSPITAL_COMMUNITY): Payer: Self-pay | Admitting: Emergency Medicine

## 2017-02-27 DIAGNOSIS — M545 Low back pain: Secondary | ICD-10-CM | POA: Insufficient documentation

## 2017-02-27 DIAGNOSIS — F17210 Nicotine dependence, cigarettes, uncomplicated: Secondary | ICD-10-CM | POA: Insufficient documentation

## 2017-02-27 DIAGNOSIS — R109 Unspecified abdominal pain: Secondary | ICD-10-CM | POA: Insufficient documentation

## 2017-02-27 LAB — PREGNANCY, URINE: Preg Test, Ur: NEGATIVE

## 2017-02-27 LAB — URINALYSIS, ROUTINE W REFLEX MICROSCOPIC
BILIRUBIN URINE: NEGATIVE
GLUCOSE, UA: NEGATIVE mg/dL
Hgb urine dipstick: NEGATIVE
Ketones, ur: NEGATIVE mg/dL
Nitrite: NEGATIVE
PH: 5 (ref 5.0–8.0)
Protein, ur: NEGATIVE mg/dL
Specific Gravity, Urine: 1.026 (ref 1.005–1.030)

## 2017-02-27 MED ORDER — MORPHINE SULFATE (PF) 4 MG/ML IV SOLN
8.0000 mg | Freq: Once | INTRAVENOUS | Status: AC
  Administered 2017-02-27: 8 mg via INTRAMUSCULAR
  Filled 2017-02-27: qty 2

## 2017-02-27 MED ORDER — ONDANSETRON 8 MG PO TBDP
8.0000 mg | ORAL_TABLET | Freq: Once | ORAL | Status: AC
  Administered 2017-02-27: 8 mg via ORAL
  Filled 2017-02-27: qty 1

## 2017-02-27 MED ORDER — METHOCARBAMOL 500 MG PO TABS: 1000.0000 mg | ORAL_TABLET | Freq: Four times a day (QID) | ORAL | 0 refills | Status: DC | PRN

## 2017-02-27 MED ORDER — NAPROXEN 250 MG PO TABS: 250.0000 mg | ORAL_TABLET | Freq: Two times a day (BID) | ORAL | 0 refills | Status: DC | PRN

## 2017-02-27 MED ORDER — TRAMADOL HCL 50 MG PO TABS: 50.0000 mg | ORAL_TABLET | Freq: Four times a day (QID) | ORAL | 0 refills | Status: DC | PRN

## 2017-02-27 NOTE — ED Notes (Signed)
Pt reports R sided lower back and flank pain, difficulty holding a stream of urine. Denies hematuria or polyuria. States "it feels like when I had a kidney stone before".

## 2017-02-27 NOTE — ED Triage Notes (Signed)
Rt flank pain on and off since tue night.  Hx of kidney stones

## 2017-02-27 NOTE — Discharge Instructions (Signed)
Take the prescriptions as directed.  Apply moist heat or ice to the area(s) of discomfort, for 15 minutes at a time, several times per day for the next few days.  Do not fall asleep on a heating or ice pack.  Call your regular medical doctor today to schedule a follow up appointment within the next 3 days.  Return to the Emergency Department immediately if worsening. ° ° °
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
## 2017-02-27 NOTE — ED Provider Notes (Signed)
[facility] Provider Note   CSN: [id number] Arrival date & time: 02/27/17  1147     History   Chief Complaint Chief Complaint  Patient presents with  . Flank Pain    HPI Elaine Morgan is a 46 y.o. female.  HPI  Pt was seen at 1220. Per pt, c/o sudden onset and persistence of constant right sided flank "pain" that began 3 days ago.  Pt describes the pain as "like my last kidney stone," and "sharp." Has been associated with nausea and multiple intermittent episodes of diarrhea.  Denies dysuria/hematuria, no abd pain, no vomiting, no black or blood in stools, no CP/SOB, no fevers, no rash, no saddle anesthesia, no focal motor weakness, no tingling/numbness in extremities, no incont/retention of bowel/bladder.    Past Medical History:  Diagnosis Date  . Chronic right shoulder pain   . Complication of anesthesia    ' I have a hard time breathing after surgery"  . Kidney stone   . Kidney stone     Patient Active Problem List   Diagnosis Date Noted  . URTI (acute upper respiratory infection) 11/27/2013  . Nephrolithiasis 11/27/2013  . UTI (lower urinary tract infection) 11/27/2013  . Dehydration 11/27/2013  . Kidney stone 11/27/2013    Past Surgical History:  Procedure Laterality Date  . ANKLE FRACTURE SURGERY Left   . CYSTOSCOPY WITH STENT PLACEMENT Left 11/28/2013   Procedure: CYSTOSCOPY WITH STENT PLACEMENT;  Surgeon: Ky Barban, MD;  Location: [facility];  Service: Urology;  Laterality: Left;  . EXTRACORPOREAL SHOCK WAVE LITHOTRIPSY Left 01/11/2014   Procedure: EXTRACORPOREAL SHOCK WAVE LITHOTRIPSY (ESWL) LEFT RENAL CALCULUS;  Surgeon: Ky Barban, MD;  Location: [facility];  Service: Urology;  Laterality: Left;  . FRACTURE SURGERY    . ORIF MANDIBULAR FRACTURE Bilateral 10/07/2013   Procedure: BILATERAL ORIF MANDIBULAR FRACTURE INTERMAXILLARY FIXATION/EXTRACTION OF TOOTH;  Surgeon: Glenna Fellows, MD;  Location: [facility];  Service: Plastics;  Laterality:  Bilateral;  ANESTHESIA: NASO TRACHEAL INTUBATION  . TUBAL LIGATION      OB History    Gravida Para Term Preterm AB Living   3 3 3     3    SAB TAB Ectopic Multiple Live Births                   Home Medications    Prior to Admission medications   Not on File    Family History No family history on file.  Social History Social History  Substance Use Topics  . Smoking status: Heavy Tobacco Smoker    Packs/day: 1.00    Years: 25.00    Types: Cigarettes  . Smokeless tobacco: Never Used  . Alcohol use No     Allergies   Hydrocodone   Review of Systems Review of Systems ROS: Statement: All systems negative except as marked or noted in the HPI; Constitutional: Negative for fever and chills. ; ; Eyes: Negative for eye pain, redness and discharge. ; ; ENMT: Negative for ear pain, hoarseness, nasal congestion, sinus pressure and sore throat. ; ; Cardiovascular: Negative for chest pain, palpitations, diaphoresis, dyspnea and peripheral edema. ; ; Respiratory: Negative for cough, wheezing and stridor. ; ; Gastrointestinal: Negative for nausea, vomiting, diarrhea, abdominal pain, blood in stool, hematemesis, jaundice and rectal bleeding. . ; ; Genitourinary: Negative for dysuria and hematuria. ; ; GYN:  No pelvic pain, no vaginal bleeding, no vaginal discharge, no vulvar pain. ;; Musculoskeletal: +LBP. Negative for neck pain. Negative for swelling  and trauma.; ; Skin: Negative for pruritus, rash, abrasions, blisters, bruising and skin lesion.; ; Neuro: Negative for headache, lightheadedness and neck stiffness. Negative for weakness, altered level of consciousness, altered mental status, extremity weakness, paresthesias, involuntary movement, seizure and syncope.       Physical Exam Updated Vital Signs BP (!) 164/115 (BP Location: Right Arm)   Pulse 69   Temp 98 F (36.7 C) (Oral)   Resp 18   Ht 5\' 7"  (1.702 m)   Wt 210 lb (95.3 kg)   LMP 01/12/2017   SpO2 100%   BMI 32.89  kg/m    BP 136/83   Pulse 65   Temp 98 F (36.7 C) (Oral)   Resp 20   Ht 5\' 7"  (1.702 m)   Wt 210 lb (95.3 kg)   LMP 01/12/2017   SpO2 96%   BMI 32.89 kg/m    Physical Exam 1225: Physical examination:  Nursing notes reviewed; Vital signs and O2 SAT reviewed;  Constitutional: Well developed, Well nourished, Well hydrated, In no acute distress; Head:  Normocephalic, atraumatic; Eyes: EOMI, PERRL, No scleral icterus; ENMT: Mouth and pharynx normal, Mucous membranes moist; Neck: Supple, Full range of motion, No lymphadenopathy; Cardiovascular: Regular rate and rhythm, No gallop; Respiratory: Breath sounds clear & equal bilaterally, No wheezes.  Speaking full sentences with ease, Normal respiratory effort/excursion; Chest: Nontender, Movement normal; Abdomen: Soft, Nontender, Nondistended, Normal bowel sounds; Genitourinary: No CVA tenderness; Spine:  No midline CS, TS, LS tenderness. +TTP right lumbar paraspinal muscles. No rash.;; Extremities: Pulses normal, No tenderness, No edema, No calf edema or asymmetry.; Neuro: AA&Ox3, Major CN grossly intact.  Speech clear. No gross focal motor or sensory deficits in extremities.; Skin: Color normal, Warm, Dry.   ED Treatments / Results  Labs (all labs ordered are listed, but only abnormal results are displayed)   EKG  EKG Interpretation None       Radiology   Procedures Procedures (including critical care time)  Medications Ordered in ED Medications  morphine 4 MG/ML injection 8 mg (8 mg Intramuscular Given 02/27/17 1256)  ondansetron (ZOFRAN-ODT) disintegrating tablet 8 mg (8 mg Oral Given 02/27/17 1256)     Initial Impression / Assessment and Plan / ED Course  I have reviewed the triage vital signs and the nursing notes.  Pertinent labs & imaging results that were available during my care of the patient were reviewed by me and considered in my medical decision making (see chart for details).  MDM Reviewed: previous chart,  nursing note and vitals Interpretation: labs and CT scan   Results for orders placed or performed during the hospital encounter of 02/27/17  Urinalysis, Routine w reflex microscopic- may I&O cath if menses  Result Value Ref Range   Color, Urine YELLOW YELLOW   APPearance HAZY (A) CLEAR   Specific Gravity, Urine 1.026 1.005 - 1.030   pH 5.0 5.0 - 8.0   Glucose, UA NEGATIVE NEGATIVE mg/dL   Hgb urine dipstick NEGATIVE NEGATIVE   Bilirubin Urine NEGATIVE NEGATIVE   Ketones, ur NEGATIVE NEGATIVE mg/dL   Protein, ur NEGATIVE NEGATIVE mg/dL   Nitrite NEGATIVE NEGATIVE   Leukocytes, UA TRACE (A) NEGATIVE   RBC / HPF 0-5 0 - 5 RBC/hpf   WBC, UA 0-5 0 - 5 WBC/hpf   Bacteria, UA RARE (A) NONE SEEN   Squamous Epithelial / LPF 6-30 (A) NONE SEEN   Mucous PRESENT   Pregnancy, urine  Result Value Ref Range   Preg Test, Ur NEGATIVE  NEGATIVE   Ct Renal Stone Study Result Date: 02/27/2017 CLINICAL DATA:  Right flank pain EXAM: CT ABDOMEN AND PELVIS WITHOUT CONTRAST TECHNIQUE: Multidetector CT imaging of the abdomen and pelvis was performed following the standard protocol without oral or intravenous contrast material administration. COMPARISON:  November 20, 2013 FINDINGS: Lower chest: Visualized lung bases are clear. Hepatobiliary: A portion of the superior- most aspect of the liver is not included in this study. Visualized portions of liver appear normal on this noncontrast enhanced study. Gallbladder wall is not appreciably thickened. There is no biliary duct dilatation. Pancreas: No mass or inflammatory focus. Spleen: No splenic lesions are evident. Adrenals/Urinary Tract: Adrenals appear normal bilaterally. There is no renal mass or hydronephrosis on either side. There are four 1 mm calculi in the posterior mid right kidney. There are two 1 mm calculi in the posterior mid left kidney. There are no ureteral calculi on either side. Urinary bladder is midline with wall thickness within normal limits.  Stomach/Bowel: Rectum is mildly distended with air. There is no appreciable bowel wall or mesenteric thickening. No bowel obstruction. No free air or portal venous air. Vascular/Lymphatic: There is atherosclerotic calcification in portions of the aorta and proximal common left iliac artery. No aneurysm. Mesenteric vessels appear patent on this noncontrast enhanced study. There is no evident adenopathy in the abdomen or pelvis. Reproductive: Uterus is anteverted. There is no pelvic mass or pelvic fluid collection. Other: Appendix appears normal. No abscess or ascites is appreciable in the abdomen or pelvis. Musculoskeletal: There is degenerative change at L5-S1 with vacuum phenomenon at this level. There is no blastic or lytic bone lesion. No intramuscular or abdominal wall lesion. IMPRESSION: Tiny calculi in each kidney. No hydronephrosis or ureteral calculus on either side. No bowel obstruction.  No abscess.  Appendix appears normal. There is aortic atherosclerosis. No aneurysm. Degenerative change at L5-S1 with vacuum phenomenon at this level. Electronically Signed   By: Bretta Bang III M.D.   On: 02/27/2017 13:30   1340:  No UTI on Udip. CT reassuring. No N/V/D while in the ED. Abd remains benign. Tx symptomatically, f/u PMD. Dx and testing d/w pt.  Questions answered.  Verb understanding, agreeable to d/c home with outpt f/u.   Final Clinical Impressions(s) / ED Diagnoses   Final diagnoses:  None    New Prescriptions New Prescriptions   No medications on file     Samuel Jester, DO 03/02/17 1610

## 2017-03-13 ENCOUNTER — Encounter (HOSPITAL_COMMUNITY): Payer: Self-pay

## 2017-03-13 ENCOUNTER — Emergency Department (HOSPITAL_COMMUNITY): Payer: Self-pay

## 2017-03-13 ENCOUNTER — Emergency Department (HOSPITAL_COMMUNITY)
Admission: EM | Admit: 2017-03-13 | Discharge: 2017-03-13 | Disposition: A | Discharge status: Home / Self Care | Payer: Self-pay | Attending: Emergency Medicine | Admitting: Emergency Medicine

## 2017-03-13 DIAGNOSIS — M722 Plantar fascial fibromatosis: Secondary | ICD-10-CM | POA: Insufficient documentation

## 2017-03-13 DIAGNOSIS — Z79899 Other long term (current) drug therapy: Secondary | ICD-10-CM | POA: Insufficient documentation

## 2017-03-13 DIAGNOSIS — F17210 Nicotine dependence, cigarettes, uncomplicated: Secondary | ICD-10-CM | POA: Insufficient documentation

## 2017-03-13 MED ORDER — PREDNISONE 50 MG PO TABS
60.0000 mg | ORAL_TABLET | Freq: Once | ORAL | Status: AC
  Administered 2017-03-13: 60 mg via ORAL
  Filled 2017-03-13: qty 1

## 2017-03-13 MED ORDER — DICLOFENAC SODIUM 75 MG PO TBEC: 75.0000 mg | DELAYED_RELEASE_TABLET | Freq: Two times a day (BID) | ORAL | 0 refills | Status: DC

## 2017-03-13 NOTE — Discharge Instructions (Signed)
Try wearing an insole in your shoe for support.  Roll your foot over a cold bottle or can to help with inflammation.  Stop the naprosyn.  Call Dr. Loralie Champagne office to arrange a follow-up appt.

## 2017-03-13 NOTE — ED Triage Notes (Signed)
Pt reports pain in left lateral foot for 2 days. No known injury states she has to walk up a hill to get to her house

## 2017-03-13 NOTE — ED Notes (Signed)
Pt awakened for VS- she is lethargic and has slightly slurred speech

## 2017-03-13 NOTE — ED Notes (Signed)
Pt informed that she will not be given narcotic pain meds for foot, she has since evolved into loud sobs.

## 2017-03-13 NOTE — ED Notes (Signed)
To radiology

## 2017-03-14 NOTE — ED Provider Notes (Signed)
[facility] Provider Note   CSN: [id number] Arrival date & time: 03/13/17  1139     History   Chief Complaint Chief Complaint  Patient presents with  . Foot Pain    HPI Elaine Morgan is a 46 y.o. female.  HPI  Elaine Morgan is a 46 y.o. female who presents to the Emergency Department complaining of pain to the lateral left foot for two days.  She describes a throbbing pain to her foot that is worse upon waking and improves with walking.  She denies known injury, but reports having to walk up a hill to get to her house.  She denies swelling, numbness or skin changes. She states pain has not been controlled with OTC pain relievers.   Past Medical History:  Diagnosis Date  . Chronic right shoulder pain   . Complication of anesthesia    ' I have a hard time breathing after surgery"  . Kidney stone   . Kidney stone     Patient Active Problem List   Diagnosis Date Noted  . URTI (acute upper respiratory infection) 11/27/2013  . Nephrolithiasis 11/27/2013  . UTI (lower urinary tract infection) 11/27/2013  . Dehydration 11/27/2013  . Kidney stone 11/27/2013    Past Surgical History:  Procedure Laterality Date  . ANKLE FRACTURE SURGERY Left   . CYSTOSCOPY WITH STENT PLACEMENT Left 11/28/2013   Procedure: CYSTOSCOPY WITH STENT PLACEMENT;  Surgeon: Ky Barban, MD;  Location: [facility];  Service: Urology;  Laterality: Left;  . EXTRACORPOREAL SHOCK WAVE LITHOTRIPSY Left 01/11/2014   Procedure: EXTRACORPOREAL SHOCK WAVE LITHOTRIPSY (ESWL) LEFT RENAL CALCULUS;  Surgeon: Ky Barban, MD;  Location: [facility];  Service: Urology;  Laterality: Left;  . FRACTURE SURGERY    . ORIF MANDIBULAR FRACTURE Bilateral 10/07/2013   Procedure: BILATERAL ORIF MANDIBULAR FRACTURE INTERMAXILLARY FIXATION/EXTRACTION OF TOOTH;  Surgeon: Glenna Fellows, MD;  Location: [facility];  Service: Plastics;  Laterality: Bilateral;  ANESTHESIA: NASO TRACHEAL INTUBATION  . TUBAL LIGATION      OB  History    Gravida Para Term Preterm AB Living   SAB TAB Ectopic Multiple Live Births                   Home Medications    Prior to Admission medications   Medication Sig Start Date End Date Taking? Authorizing Provider  diclofenac (VOLTAREN) 75 MG EC tablet Take 1 tablet (75 mg total) by mouth 2 (two) times daily. Take with food 03/13/17   Aarya Quebedeaux, PA-C  methocarbamol (ROBAXIN) 500 MG tablet Take 2 tablets (1,000 mg total) by mouth 4 (four) times daily as needed for muscle spasms (muscle spasm/pain). 02/27/17   Samuel Jester, DO  traMADol (ULTRAM) 50 MG tablet Take 1 tablet (50 mg total) by mouth every 6 (six) hours as needed for moderate pain or severe pain. 02/27/17   Samuel Jester, DO    Family History No family history on file.  Social History Social History  Substance Use Topics  . Smoking status: Heavy Tobacco Smoker    Packs/day: 2.00    Years: 25.00    Types: Cigarettes  . Smokeless tobacco: Never Used  . Alcohol use No     Allergies   Hydrocodone   Review of Systems Review of Systems  Constitutional: Negative for chills and fever.  Musculoskeletal: Positive for arthralgias (left foot pain). Negative for joint swelling.  Skin: Negative for color change  and wound.  Neurological: Negative for weakness and numbness.  All other systems reviewed and are negative.    Physical Exam Updated Vital Signs BP (!) 156/88 (BP Location: Right Arm)   Pulse 69   Temp 97.8 F (36.6 C) (Oral)   Resp 16   Ht  (1.702 m)   Wt 90.7 kg   LMP 01/13/2017   SpO2 96%   BMI 31.32 kg/m   Physical Exam  Constitutional: She is oriented to person, place, and time. She appears well-developed and well-nourished. No distress.  HENT:  Head: Normocephalic and atraumatic.  Cardiovascular: Normal rate, regular rhythm and intact distal pulses.   Pulmonary/Chest: Effort normal and breath sounds normal.  Musculoskeletal: She exhibits tenderness. She  exhibits no edema or deformity.  ttp of the lateral and medial aspects of the left foot.  No edema.  No proximal tenderness.  Neurological: She is alert and oriented to person, place, and time. No sensory deficit. She exhibits normal muscle tone. Coordination normal.  Skin: Skin is warm and dry. Capillary refill takes less than 2 seconds. No rash noted.  Nursing note and vitals reviewed.    ED Treatments / Results  Labs (all labs ordered are listed, but only abnormal results are displayed) Labs Reviewed - No data to display  EKG  EKG Interpretation None       Radiology Dg Foot Complete Left  Result Date: 03/13/2017 CLINICAL DATA:  Fifth metatarsal pain secondary to a twisting injury several days ago. EXAM: LEFT FOOT - COMPLETE 3+ VIEW COMPARISON:  None. FINDINGS: There is no evidence of fracture or dislocation. Minimal degenerative changes at the first MTP joint with bunion formation. Fixation screw is in the distal fibula and distal tibia. K-wire in the distal tibia as well. Soft tissues are unremarkable. IMPRESSION: No acute abnormalities. Electronically Signed   By: Francene Boyers M.D.   On: 03/13/2017 12:30    Procedures Procedures (including critical care time)  Medications Ordered in ED Medications  predniSONE (DELTASONE) tablet 60 mg (60 mg Oral Given 03/13/17 1353)     Initial Impression / Assessment and Plan / ED Course  I have reviewed the triage vital signs and the nursing notes.  Pertinent labs & imaging results that were available during my care of the patient were reviewed by me and considered in my medical decision making (see chart for details).    ACE wrap applied to the foot by nursing.  XR neg for fx.  No reported injury.  Sx's likely related to plantar fasciitis.  NV intact.  Pt requesting pain medication.  I have explained that sx's would be addressed with NSAID therapy, advised her to get insole orthotic and ice.  Patient became upset began to cry. I have  explained that narcotic medication is not indicated.   Final Clinical Impressions(s) / ED Diagnoses   Final diagnoses:  Plantar fasciitis    New Prescriptions Discharge Medication List as of 03/13/2017  1:49 PM    START taking these medications   Details  diclofenac (VOLTAREN) 75 MG EC tablet Take 1 tablet (75 mg total) by mouth 2 (two) times daily. Take with food, Starting Fri 03/13/2017, Print         Brinlynn Gorton [place], PA-C 03/14/17 1545    Eber Hong, MD 03/14/17 Corky Crafts

## 2019-02-04 ENCOUNTER — Emergency Department (HOSPITAL_COMMUNITY)
Admission: EM | Admit: 2019-02-04 | Discharge: 2019-02-04 | Disposition: A | Discharge status: Home / Self Care | Payer: Self-pay | Attending: Emergency Medicine | Admitting: Emergency Medicine

## 2019-02-04 ENCOUNTER — Emergency Department (HOSPITAL_COMMUNITY): Payer: Self-pay

## 2019-02-04 ENCOUNTER — Encounter (HOSPITAL_COMMUNITY): Payer: Self-pay

## 2019-02-04 DIAGNOSIS — F1910 Other psychoactive substance abuse, uncomplicated: Secondary | ICD-10-CM | POA: Insufficient documentation

## 2019-02-04 DIAGNOSIS — R55 Syncope and collapse: Secondary | ICD-10-CM

## 2019-02-04 DIAGNOSIS — I671 Cerebral aneurysm, nonruptured: Secondary | ICD-10-CM | POA: Insufficient documentation

## 2019-02-04 DIAGNOSIS — Z79899 Other long term (current) drug therapy: Secondary | ICD-10-CM | POA: Insufficient documentation

## 2019-02-04 DIAGNOSIS — F17210 Nicotine dependence, cigarettes, uncomplicated: Secondary | ICD-10-CM | POA: Insufficient documentation

## 2019-02-04 LAB — CBC WITH DIFFERENTIAL/PLATELET
Abs Immature Granulocytes: 0.07 10*3/uL (ref 0.00–0.07)
Basophils Absolute: 0.1 10*3/uL (ref 0.0–0.1)
Basophils Relative: 1 %
EOS PCT: 1 %
Eosinophils Absolute: 0.1 10*3/uL (ref 0.0–0.5)
HCT: 44.6 % (ref 36.0–46.0)
Hemoglobin: 14.6 g/dL (ref 12.0–15.0)
Immature Granulocytes: 1 %
Lymphocytes Relative: 15 %
Lymphs Abs: 1.8 10*3/uL (ref 0.7–4.0)
MCH: 32.2 pg (ref 26.0–34.0)
MCHC: 32.7 g/dL (ref 30.0–36.0)
MCV: 98.5 fL (ref 80.0–100.0)
Monocytes Absolute: 0.9 10*3/uL (ref 0.1–1.0)
Monocytes Relative: 7 %
Neutro Abs: 8.9 10*3/uL — ABNORMAL HIGH (ref 1.7–7.7)
Neutrophils Relative %: 75 %
Platelets: 188 10*3/uL (ref 150–400)
RBC: 4.53 MIL/uL (ref 3.87–5.11)
RDW: 12.4 % (ref 11.5–15.5)
WBC: 11.8 10*3/uL — ABNORMAL HIGH (ref 4.0–10.5)
nRBC: 0 % (ref 0.0–0.2)

## 2019-02-04 LAB — RAPID URINE DRUG SCREEN, HOSP PERFORMED
Amphetamines: POSITIVE — AB
Barbiturates: NOT DETECTED
Benzodiazepines: NOT DETECTED
Cocaine: POSITIVE — AB
OPIATES: POSITIVE — AB
TETRAHYDROCANNABINOL: NOT DETECTED

## 2019-02-04 LAB — URINALYSIS, ROUTINE W REFLEX MICROSCOPIC
Bilirubin Urine: NEGATIVE
Glucose, UA: NEGATIVE mg/dL
Hgb urine dipstick: NEGATIVE
Ketones, ur: NEGATIVE mg/dL
LEUKOCYTE UA: NEGATIVE
Nitrite: NEGATIVE
Protein, ur: NEGATIVE mg/dL
Specific Gravity, Urine: 1.02 (ref 1.005–1.030)
pH: 6 (ref 5.0–8.0)

## 2019-02-04 LAB — COMPREHENSIVE METABOLIC PANEL
ALK PHOS: 76 U/L (ref 38–126)
ALT: 22 U/L (ref 0–44)
AST: 23 U/L (ref 15–41)
Albumin: 4.4 g/dL (ref 3.5–5.0)
Anion gap: 10 (ref 5–15)
BUN: 27 mg/dL — AB (ref 6–20)
CO2: 27 mmol/L (ref 22–32)
Calcium: 9.3 mg/dL (ref 8.9–10.3)
Chloride: 100 mmol/L (ref 98–111)
Creatinine, Ser: 1.36 mg/dL — ABNORMAL HIGH (ref 0.44–1.00)
GFR calc Af Amer: 53 mL/min — ABNORMAL LOW (ref >60)
GFR calc non Af Amer: 46 mL/min — ABNORMAL LOW (ref >60)
Glucose, Bld: 106 mg/dL — ABNORMAL HIGH (ref 70–99)
Potassium: 4 mmol/L (ref 3.5–5.1)
Sodium: 137 mmol/L (ref 135–145)
Total Bilirubin: 0.5 mg/dL (ref 0.3–1.2)
Total Protein: 8 g/dL (ref 6.5–8.1)

## 2019-02-04 LAB — ACETAMINOPHEN LEVEL: Acetaminophen (Tylenol), Serum: <10 ug/mL — ABNORMAL LOW (ref 10–30)

## 2019-02-04 LAB — PREGNANCY, URINE: Preg Test, Ur: NEGATIVE

## 2019-02-04 LAB — SALICYLATE LEVEL: Salicylate Lvl: <7 mg/dL (ref 2.8–30.0)

## 2019-02-04 LAB — ETHANOL: Alcohol, Ethyl (B): <10 mg/dL (ref <10)

## 2019-02-04 MED ORDER — SODIUM CHLORIDE 0.9 % IV BOLUS
1000.0000 mL | Freq: Once | INTRAVENOUS | Status: AC
  Administered 2019-02-04: 1000 mL via INTRAVENOUS

## 2019-02-04 MED ORDER — ONDANSETRON HCL 4 MG/2ML IJ SOLN
4.0000 mg | Freq: Once | INTRAMUSCULAR | Status: AC
  Administered 2019-02-04: 4 mg via INTRAVENOUS
  Filled 2019-02-04: qty 2

## 2019-02-04 MED ORDER — IOHEXOL 350 MG/ML SOLN
75.0000 mL | Freq: Once | INTRAVENOUS | Status: AC | PRN
  Administered 2019-02-04: 75 mL via INTRAVENOUS

## 2019-02-04 MED ORDER — SODIUM CHLORIDE 0.9 % IV SOLN
INTRAVENOUS | Status: DC
  Administered 2019-02-04: 12:00:00 via INTRAVENOUS

## 2019-02-04 MED ORDER — ASPIRIN 81 MG PO CHEW: 324.0000 mg | CHEWABLE_TABLET | Freq: Once | ORAL | Status: DC

## 2019-02-04 NOTE — ED Triage Notes (Signed)
Pt in by [facility] after being found unresponsive with agonal resp.  Pt minimally responsive upon arrival of ems.  Pt is now awake and alert, admits to crack and alcohol use.   Pt denies complaints

## 2019-02-04 NOTE — ED Provider Notes (Signed)
[facility] EMERGENCY DEPARTMENT Provider Note   CSN: [id number] Arrival date & time: 02/04/19  8335    History   Chief Complaint Chief Complaint  Patient presents with  . Loss of Consciousness    HPI Elaine Morgan is a 48 y.o. female.     EMS reports patient was found unresponsive at her friend's house with agonal respirations.  Call out was for unconscious person.  EMS reports that patient was agonal on arrival but became more responsive in route.  She did not get any Narcan.  Patient admits to smoking crack last night as well as drinking alcohol heavily. Denies any IV drug abuse.  Patient has had several episodes of vomiting since arriving at the hospital.  She denies any abdominal pain, diarrhea.  She denies using any IV drugs or taking any pills.  The history is provided by the patient and the EMS personnel.  Loss of Consciousness  Associated symptoms: headaches, nausea and vomiting   Associated symptoms: no chest pain, no dizziness, no fever, no seizures and no shortness of breath     Past Medical History:  Diagnosis Date  . Chronic right shoulder pain   . Complication of anesthesia    ' I have a hard time breathing after surgery"  . Kidney stone   . Kidney stone     Patient Active Problem List   Diagnosis Date Noted  . URTI (acute upper respiratory infection) 11/27/2013  . Nephrolithiasis 11/27/2013  . UTI (lower urinary tract infection) 11/27/2013  . Dehydration 11/27/2013  . Kidney stone 11/27/2013    Past Surgical History:  Procedure Laterality Date  . ANKLE FRACTURE SURGERY Left   . CYSTOSCOPY WITH STENT PLACEMENT Left 11/28/2013   Procedure: CYSTOSCOPY WITH STENT PLACEMENT;  Surgeon: Ky Barban, MD;  Location: [facility];  Service: Urology;  Laterality: Left;  . EXTRACORPOREAL SHOCK WAVE LITHOTRIPSY Left 01/11/2014   Procedure: EXTRACORPOREAL SHOCK WAVE LITHOTRIPSY (ESWL) LEFT RENAL CALCULUS;  Surgeon: Ky Barban, MD;  Location: [facility];   Service: Urology;  Laterality: Left;  . FRACTURE SURGERY    . ORIF MANDIBULAR FRACTURE Bilateral 10/07/2013   Procedure: BILATERAL ORIF MANDIBULAR FRACTURE INTERMAXILLARY FIXATION/EXTRACTION OF TOOTH;  Surgeon: Glenna Fellows, MD;  Location: [facility];  Service: Plastics;  Laterality: Bilateral;  ANESTHESIA: NASO TRACHEAL INTUBATION  . TUBAL LIGATION       OB History    Gravida  3   Para  3   Term  3   Preterm      AB      Living  3     SAB      TAB      Ectopic      Multiple      Live Births               Home Medications    Prior to Admission medications   Medication Sig Start Date End Date Taking? Authorizing Provider  diclofenac (VOLTAREN) 75 MG EC tablet Take 1 tablet (75 mg total) by mouth 2 (two) times daily. Take with food 03/13/17   Triplett, Tammy, PA-C  methocarbamol (ROBAXIN) 500 MG tablet Take 2 tablets (1,000 mg total) by mouth 4 (four) times daily as needed for muscle spasms (muscle spasm/pain). 02/27/17   Samuel Jester, DO  traMADol (ULTRAM) 50 MG tablet Take 1 tablet (50 mg total) by mouth every 6 (six) hours as needed for moderate pain or severe pain. 02/27/17   Samuel Jester, DO  Family History No family history on file.  Social History Social History   Tobacco Use  . Smoking status: Heavy Tobacco Smoker    Packs/day: 2.00    Years: 25.00    Pack years: 50.00    Types: Cigarettes  . Smokeless tobacco: Never Used  Substance Use Topics  . Alcohol use: No  . Drug use: No     Allergies   Hydrocodone   Review of Systems Review of Systems  Constitutional: Negative for activity change, appetite change and fever.  HENT: Negative for congestion and rhinorrhea.   Eyes: Negative for visual disturbance.  Respiratory: Negative for cough, chest tightness and shortness of breath.   Cardiovascular: Positive for syncope. Negative for chest pain.  Gastrointestinal: Positive for nausea and vomiting. Negative for abdominal pain.    Genitourinary: Negative for dysuria and hematuria.  Musculoskeletal: Negative for arthralgias.  Skin: Negative for rash.  Neurological: Positive for headaches. Negative for dizziness and seizures.  Psychiatric/Behavioral: Negative for suicidal ideas.   all other systems are negative except as noted in the HPI and PMH.     Physical Exam Updated Vital Signs BP (!) 150/99   Pulse 87   Resp 20   Ht  (1.702 m)   Wt 99.8 kg   SpO2 95%   BMI 34.46 kg/m   Physical Exam Vitals signs and nursing note reviewed.  Constitutional:      General: She is not in acute distress.    Appearance: Normal appearance. She is well-developed and normal weight. She is not ill-appearing.     Comments: Dry heaving Alert and oriented x3, answering questions appropriately  HENT:     Head: Normocephalic and atraumatic.     Nose: Nose normal. No rhinorrhea.     Mouth/Throat:     Pharynx: No oropharyngeal exudate.  Eyes:     Conjunctiva/sclera: Conjunctivae normal.     Pupils: Pupils are equal, round, and reactive to light.  Neck:     Musculoskeletal: Normal range of motion and neck supple.     Comments: No meningismus. Cardiovascular:     Rate and Rhythm: Normal rate and regular rhythm.     Heart sounds: Normal heart sounds. No murmur.  Pulmonary:     Effort: Pulmonary effort is normal. No respiratory distress.     Breath sounds: Normal breath sounds.  Abdominal:     Palpations: Abdomen is soft.     Tenderness: There is no abdominal tenderness. There is no guarding or rebound.  Musculoskeletal: Normal range of motion.        General: No tenderness.  Skin:    General: Skin is warm.     Capillary Refill: Capillary refill takes less than 2 seconds.  Neurological:     General: No focal deficit present.     Mental Status: She is alert and oriented to person, place, and time.     Cranial Nerves: No cranial nerve deficit.     Motor: No abnormal muscle tone.     Coordination: Coordination  normal.     Comments: No ataxia on finger to nose bilaterally. No pronator drift. 5/5 strength throughout. CN 2-12 intact.Equal grip strength. Sensation intact.   Psychiatric:        Behavior: Behavior normal.      ED Treatments / Results  Labs (all labs ordered are listed, but only abnormal results are displayed) Labs Reviewed  CBC WITH DIFFERENTIAL/PLATELET - Abnormal; Notable for the following components:      Result  Value   WBC 11.8 (*)    Neutro Abs 8.9 (*)    All other components within normal limits  COMPREHENSIVE METABOLIC PANEL - Abnormal; Notable for the following components:   Glucose, Bld 106 (*)    BUN 27 (*)    Creatinine, Ser 1.36 (*)    GFR calc non Af Amer 46 (*)    GFR calc Af Amer 53 (*)    All other components within normal limits  ACETAMINOPHEN LEVEL - Abnormal; Notable for the following components:   Acetaminophen (Tylenol), Serum <10 (*)    All other components within normal limits  URINALYSIS, ROUTINE W REFLEX MICROSCOPIC  PREGNANCY, URINE  SALICYLATE LEVEL  ETHANOL  RAPID URINE DRUG SCREEN, HOSP PERFORMED    EKG EKG Interpretation  Date/Time:  Friday February 04 2019 06:34:58 EST Ventricular Rate:  80 PR Interval:    QRS Duration: 95 QT Interval:  429 QTC Calculation: 495 R Axis:   9 Text Interpretation:  Sinus rhythm Borderline prolonged QT interval No previous ECGs available Confirmed by Glynn Octave [phone]) on 02/04/2019 6:36:33 AM   Radiology No results found.  Procedures Procedures (including critical care time)  Medications Ordered in ED Medications - No data to display   Initial Impression / Assessment and Plan / ED Course  I have reviewed the triage vital signs and the nursing notes.  Pertinent labs & imaging results that were available during my care of the patient were reviewed by me and considered in my medical decision making (see chart for details).        Altered mental status after drinking alcohol and using  cocaine.  Vitals stable.  No distress.  GCS 15  Vitals are stable.  Patient protecting airway.  She is answering questions appropriately and is oriented.  Patient will be hydrated and labs will be obtained.  Her EKG is sinus rhythm.  Labs showed normal anion gap.  Negative salicylate and acetaminophen levels.  Alcohol level surprisingly negative.  Anion gap is normal so there is low suspicion for volatile alcohol ingestion.  Patient states she was drinking 8% "steel case" wine in a can.  Drug screen pending.  CT head pending. Patient will need to be observed and ensure that she returns back to baseline.  Care of be transferred to Dr. Clarene Duke at shift change. Final Clinical Impressions(s) / ED Diagnoses   Final diagnoses:  None    ED Discharge Orders    None       Giulian Goldring, Jeannett Senior, MD 02/04/19 [phone]

## 2019-02-04 NOTE — Discharge Instructions (Addendum)
Substance Abuse Treatment Programs ° °Intensive Outpatient Programs °[facility]     °[address]      °[place], [place]                   °[phone]      ° °[facility] °[address] °[place], [place] °[phone] ° °[facility]     °(Inpatient and outpatient)     °[address]           °[phone]   ° °[facility] °[phone] (Suboxone and Methadone) ° °[address]      °[place], [place] [address]      °[phone]      ° °[address] °[place], [place] °[phone] ° °[facility] (Outpatient/Inpatient, [facility])    °(insurance only) [phone]      °       °[facility] (Groups & Residential) °[place], [place] °[phone] ° °   °[facility]     °[address]     °[place], [place]      °[phone]      ° °[facility] (for caregivers and family) °[address] °[place], [place] °[phone] ° ° ° ° ° °Residential Treatment Programs °[facility]      °[address], [place], [place] [address]  °[phone]      ° °[facility] °[address] [place], [place] [address] °[phone] ° °[facility]        °[phone]      ° °[facility] °[phone] ° °[facility]             °[address]                                         °[place], [place]                                                °[phone] or [phone]                              ° °[facility] [place] °[address] °[place], [address] °[phone] ° °[facility]    °[address]      °, [place]     °[phone]      ° °[facility] °[address] °, [place] °[phone] ° °[facility]   °[address]     °[place], [place] [address]     °[phone]      °Admissions: 8am-3pm M-F ° °[facility] °[address] °[place],  [place] °[phone] ° °[facility]: Residential Program (90 Days)   °[place], [place]      °[phone] or [phone]    ° °[facility] °[place], [place] °(Walk in Hours over the weekend or by referral) ° °[facility] °[address], [place], [place] [address] °([phone] ° °Crisis Mobile: [facility]:  1-877-626-1772 (for crisis response 24 hours a day) °[facility] Hotline:      1-800-256-2452 °Outpatient Psychiatry and Counseling ° °[facility]: Mobile Crisis   Management 24 hours:  1-579-867-5796  [facility] of the [employer] sliding scale fee and walk in schedule: M-F 8am-12pm/1pm-3pm [address]  [place], [place] [address] [place], [place] [address] [phone]  [facility] ([facility])- new patient walk-in appointments available Monday - Friday 8am -3pm.          [address] [place], [place] [address] [phone] or crisis line- [facility]/ Intensive Outpatient Therapy Program [place], [place] [address] [place]      [phone] [place], [place] [address]                 [place]   [facility] [phone]. [place], [place] [address]   [facility]          [address] Johnette Abraham  [place], [place] [address]       [phone]  [facility] [address] [place], [place] [address] [phone]  [facility]    [address] [place], [place] [address]     [place], [place] Joycelyn Man     [place] [address]     [phone]       [facility] [place] [address]  [facility]     [address], [place], [place], [place] [address] [place]     [address]     [place], [place] [address]     [phone]       [facility] [address] [place], [place] [address] [phone] Resources for Temporary Residential Assistance/Crisis [facility]) M-F 8am-3pm   [address] [place], [place] [address]   [phone] Services include: laundry, barbering, support groups, case management, phone  & computer access, showers, AA/NA mtgs, mental health/substance abuse nurse, job skills class, disability information, VA assistance, spiritual classes, etc.   [facility]   [address], [place]     [place]              [employer] (women and children)       [place]. [place], [place] [address] [phone] [email or web address] for application and process Application Required  [facility]   [address] [place] [address]    [place] [address]     [phone]                    [place],  [address] [phone](OILNZVJK application appt.) Application Required  [facility] (women only)    [address]     [place],  [address]     [phone]  Intake starts 6pm daily Need valid ID, SSC, & Police report [employer] [address] [place], [place] [phone] Application Required  [facility] (men only)     [address]      [place], [place]     [phone]       [facility] [place] (Pregnant women only) [address] [place], [place] [phone]  [facility]      [address]      [place], [place] [address]     [phone]             [facility] [address] [place], [place] [phone] 90 day commitment/SA/Application process  [facility](men only)     [address]     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 Call the Neurosurgeon on Monday to schedule a follow up appointment within the next week. Call your regular medical doctor on Monday to schedule a follow up appointment within the next 3 days. Call the resources given to you today if you are interested in detox programs.  Return to the Emergency Department immediately sooner if worsening.

## 2019-02-04 NOTE — ED Provider Notes (Signed)
Pt received at sign out with CT head and CXR pending. See previous EDP note for full HPI/H&P/MDM. Pt is 48yo F, found unresponsive by friend. EMS stated pt became responsive en route. Had several episodes of N/V on arrival to ED. Pt admits to crack and heavy etoh use last night. Pt currently A&O, resps easy, lungs CTA bilat, RRR, abd soft/NT, Neuro exam:  Major CN grossly intact. Speech clear.  No facial droop.  No nystagmus. Grips equal. Strength 5/5 equal bilat UE's and LE's.  DTR 2/4 equal bilat UE's and LE's.  No gross sensory deficits.  Normal cerebellar testing bilat UE's (finger-nose) and LE's (heel-shin). UDS +polysubstance abuse, etoh negative.  CT with acute stroke (below); pt not code stroke given unknown LKW. 0845: T/C returned from Tele Neuro Dr. Alfredo Bach, case discussed, including:  HPI, pertinent PM/SHx, VS/PE, dx testing, ED course and treatment: obtain MRI/A/V, ASA  and further treatment(s) pending MRI results, then will admit for further stroke workup.   1100:  MRIs negative for stroke; recommends CTA for aneurysm finding. Ordered.  1435:  Pt remains with intact neuro exam. Pt has ambulated with steady gait, easy resps, NAD. Pt has tol PO well without N/V. CTA with unruptured aneurysm. T/C returned from NeuroSurgery Dr. Wynetta Emery, case discussed, including:  HPI, pertinent PM/SHx, VS/PE, dx testing, ED course and treatment:  With no rupture and no stroke there is no acute intervention needed at this time, pt can be f/u as outpt with Dr. Conchita Paris.   1440:  T/C returned from Tele Neuro Dr. Alfredo Bach, case discussed, including:  HPI, pertinent PM/SHx, VS/PE, dx testing, ED course and treatment:  Agrees findings are reassuring, f/u Neurosurgery for aneurysm, no further workup needed at this time.   1500:  Dx and testing, as well as d/w Neurology and Neurosurgeon, d/w pt and family.  Questions answered.  Verb understanding, agreeable to d/c home with outpt f/u.     Patient Vitals for the past 24  hrs:  BP Temp Temp src Pulse Resp SpO2 Height Weight  02/04/19 0845 (!) 144/94 - - 79 20 96 % - -  02/04/19 0750 (!) 146/97 (!) 94.5 F (34.7 C) Oral 69 17 96 % - -  02/04/19 0624 - - - - 20 -  (1.702 m) 99.8 kg  02/04/19 0618 - - - 87 - 95 % - -  02/04/19 0617 (!) 150/99 - - - - - - -   MDM Reviewed: previous chart, nursing note and vitals Interpretation: labs, x-ray, CT scan, MRI and ECG Total time providing critical care: 30-74 minutes. This excludes time spent performing separately reportable procedures and services. Consults: neurology and admitting MD   CRITICAL CARE Performed by: Samuel Jester Total critical care time: 35 minutes Critical care time was exclusive of separately billable procedures and treating other patients. Critical care was necessary to treat or prevent imminent or life-threatening deterioration. Critical care was time spent personally by me on the following activities: development of treatment plan with patient and/or surrogate as well as nursing, discussions with consultants, evaluation of patient's response to treatment, examination of patient, obtaining history from patient or surrogate, ordering and performing treatments and interventions, ordering and review of laboratory studies, ordering and review of radiographic studies, pulse oximetry and re-evaluation of patient's condition.   Results for orders placed or performed during the hospital encounter of 02/04/19  Rapid urine drug screen (hospital performed)  Result Value Ref Range   Opiates POSITIVE (A) NONE DETECTED  Cocaine POSITIVE (A) NONE DETECTED   Benzodiazepines NONE DETECTED NONE DETECTED   Amphetamines POSITIVE (A) NONE DETECTED   Tetrahydrocannabinol NONE DETECTED NONE DETECTED   Barbiturates NONE DETECTED NONE DETECTED  Urinalysis, Routine w reflex microscopic  Result Value Ref Range   Color, Urine YELLOW YELLOW   APPearance CLEAR CLEAR   Specific Gravity, Urine 1.020 1.005 -  1.030   pH 6.0 5.0 - 8.0   Glucose, UA NEGATIVE NEGATIVE mg/dL   Hgb urine dipstick NEGATIVE NEGATIVE   Bilirubin Urine NEGATIVE NEGATIVE   Ketones, ur NEGATIVE NEGATIVE mg/dL   Protein, ur NEGATIVE NEGATIVE mg/dL   Nitrite NEGATIVE NEGATIVE   Leukocytes,Ua NEGATIVE NEGATIVE  Pregnancy, urine  Result Value Ref Range   Preg Test, Ur NEGATIVE NEGATIVE  CBC with Differential/Platelet  Result Value Ref Range   WBC 11.8 (H) 4.0 - 10.5 K/uL   RBC 4.53 3.87 - 5.11 MIL/uL   Hemoglobin 14.6 12.0 - 15.0 g/dL   HCT 51.7 00.1 - 74.9 %   MCV 98.5 80.0 - 100.0 fL   MCH 32.2 26.0 - 34.0 pg   MCHC 32.7 30.0 - 36.0 g/dL   RDW 44.9 67.5 - 91.6 %   Platelets 188 150 - 400 K/uL   nRBC 0.0 0.0 - 0.2 %   Neutrophils Relative % 75 %   Neutro Abs 8.9 (H) 1.7 - 7.7 K/uL   Lymphocytes Relative 15 %   Lymphs Abs 1.8 0.7 - 4.0 K/uL   Monocytes Relative 7 %   Monocytes Absolute 0.9 0.1 - 1.0 K/uL   Eosinophils Relative 1 %   Eosinophils Absolute 0.1 0.0 - 0.5 K/uL   Basophils Relative 1 %   Basophils Absolute 0.1 0.0 - 0.1 K/uL   Immature Granulocytes 1 %   Abs Immature Granulocytes 0.07 0.00 - 0.07 K/uL  Comprehensive metabolic panel  Result Value Ref Range   Sodium 137 135 - 145 mmol/L   Potassium 4.0 3.5 - 5.1 mmol/L   Chloride 100 98 - 111 mmol/L   CO2 27 22 - 32 mmol/L   Glucose, Bld 106 (H) 70 - 99 mg/dL   BUN 27 (H) 6 - 20 mg/dL   Creatinine, Ser 3.84 (H) 0.44 - 1.00 mg/dL   Calcium 9.3 8.9 - 66.5 mg/dL   Total Protein 8.0 6.5 - 8.1 g/dL   Albumin 4.4 3.5 - 5.0 g/dL   AST 23 15 - 41 U/L   ALT 22 0 - 44 U/L   Alkaline Phosphatase 76 38 - 126 U/L   Total Bilirubin 0.5 0.3 - 1.2 mg/dL   GFR calc non Af Amer 46 (L) >60 mL/min   GFR calc Af Amer 53 (L) >60 mL/min   Anion gap 10 5 - 15  Acetaminophen level  Result Value Ref Range   Acetaminophen (Tylenol), Serum <10 (L) 10 - 30 ug/mL  Salicylate level  Result Value Ref Range   Salicylate Lvl <7.0 2.8 - 30.0 mg/dL  Ethanol  Result  Value Ref Range   Alcohol, Ethyl (B) <10 <10 mg/dL   Dg Chest 2 View Result Date: 02/04/2019 CLINICAL DATA:  Altered mental status EXAM: CHEST - 2 VIEW COMPARISON:  November 27, 2013 FINDINGS: Lungs are clear. Heart size and pulmonary vascularity are normal. No adenopathy. 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 IMPRESSION: No edema or consolidation.  Stable cardiac silhouette. Electronically Signed   By: Bretta Bang III M.D.   On: 02/04/2019  08:43   Ct Head Wo Contrast Result Date: 02/04/2019 CLINICAL DATA:  Unresponsive.  Crack cocaine use EXAM: CT HEAD WITHOUT CONTRAST TECHNIQUE: Contiguous axial images were obtained from the base of the skull through the vertex without intravenous contrast. COMPARISON:  October 05, 2013 FINDINGS: Brain: The ventricles are normal in size and configuration. There is no demonstrable mass, hemorrhage, extra-axial fluid collection, midline shift. There is decreased attenuation in the midportion right temporal lobe with apparent sparing along the periphery of the right temporal lobe. Elsewhere brain parenchyma appears unremarkable except for stable slight decreased attenuation immediately adjacent to the frontal horn of the left lateral ventricle. Vascular: No hyperdense vessel. No vascular calcifications are evident. Skull: The bony calvarium appears intact. Sinuses/Orbits: There is mucosal thickening in several ethmoid air cells. There is rightward deviation of the nasal septum. Other visualized paranasal sinuses are clear. Orbits appear symmetric bilaterally. Other: Mastoid air cells are clear. IMPRESSION: Decreased attenuation and a portion of the mid right temporal lobe with sparing along the periphery of the right temporal lobe laterally. Suspect acute infarct in this area with cytotoxic edema. This somewhat atypical distribution potentially could indicate acute venous infarction as opposed  to arterial infarction. Acute arterial infarction is not excluded, however. Elsewhere brain parenchyma appears normal except for slight small vessel disease adjacent to the frontal horn of the left lateral ventricle, stable. No mass or hemorrhage is appreciable. There is mucosal thickening in several ethmoid air cells. There is rightward deviation of the nasal septum. Electronically Signed   By: Bretta Bang III M.D.   On: 02/04/2019 08:23    Mr Brain Wo Contrast (neuro Protocol) Result Date: 02/04/2019 CLINICAL DATA:  Abnormal CT.  Rule out stroke. EXAM: MRI HEAD WITHOUT CONTRAST TECHNIQUE: Multiplanar, multiecho pulse sequences of the brain and surrounding structures were obtained without intravenous contrast. COMPARISON:  CT head 02/04/2019 FINDINGS: Brain: Negative for acute infarct. Right temporal hypodensity appears to be streak artifact. Ventricle size and cerebral volume normal. Scattered small white matter hyperintensities compatible with mild chronic microvascular ischemia. Negative for hemorrhage or mass. Image quality degraded by motion. Vascular: Normal arterial flow voids Skull and upper cervical spine: Negative Sinuses/Orbits: Negative Other: None IMPRESSION: Negative for acute infarct.  CT finding was artifactual. Electronically Signed   By: Marlan Palau M.D.   On: 02/04/2019 10:48    Mr Maxine Glenn Head (cerebral Arteries) Result Date: 02/04/2019 CLINICAL DATA:  Unresponsive.  Abnormal CT. EXAM: MRA HEAD WITHOUT CONTRAST TECHNIQUE: Angiographic images of the Circle of Willis were obtained using MRA technique without intravenous contrast. COMPARISON:  MRI head 02/04/2019 FINDINGS: Both vertebral arteries widely patent. Misregistration in the distal basilar. No basilar stenosis. Superior cerebellar and posterior cerebral arteries patent bilaterally. PICA patent bilaterally. Misregistration through the cavernous carotid which appears patent bilaterally. Anterior and middle cerebral arteries  appear patent. Mild irregularity at the MCA bifurcation bilaterally felt to be motion related artifact. 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 IMPRESSION: Motion degraded study without significant stenosis. Negative for large vessel occlusion. 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 Given the amount of motion on the study, follow-up CTA head recommended for further evaluation. Electronically Signed   By: Marlan Palau M.D.   On: 02/04/2019 10:53   Mr Mrv Head Wo Cm Result Date: 02/04/2019 CLINICAL DATA:  Unresponsive.  Abnormal CT head EXAM: MR VENOGRAM the HEAD WITHOUT CONTRAST TECHNIQUE: Angiographic images of the intracranial venous structures were obtained using MRV technique without  intravenous contrast. COMPARISON:  MRI head 02/04/2019 FINDINGS: Superior sagittal sinus widely patent. Straight sinus patent. Transverse sinus and sigmoid sinus patent bilaterally and dominant on the right side. Vein of Labbe well seen on the right but not as well seen on the left. IMPRESSION: Negative Electronically Signed   By: Marlan Palau M.D.   On: 02/04/2019 10:55    Ct Angio Head W Or Wo Contrast Result Date: 02/04/2019 CLINICAL DATA:  Cerebral aneurysm asymptomatic EXAM: CT ANGIOGRAPHY HEAD TECHNIQUE: Multidetector CT imaging of the head was performed using the standard protocol during bolus administration of intravenous contrast. Multiplanar CT image reconstructions and MIPs were obtained to evaluate the vascular anatomy. CONTRAST:  64mL OMNIPAQUE IOHEXOL 350 MG/ML SOLN COMPARISON:  MRA head 02/04/2019.  CT head and MRI head 02/04/2019 FINDINGS: CTA HEAD Anterior circulation: Right cavernous carotid widely patent without stenosis or significant atherosclerotic disease. Aneurysm of the right cavernous carotid projects into the sella on the right. The aneurysm measures approximately 6 x 7 mm and has a smooth contour. Right anterior and middle cerebral arteries widely patent Left  cavernous carotid widely patent without stenosis or aneurysm. Left anterior and middle cerebral arteries widely patent. Posterior circulation: Both vertebral arteries patent to the basilar without stenosis. PICA patent bilaterally. Basilar widely patent. Superior cerebellar and posterior cerebral arteries widely patent. No aneurysm in the posterior circulation. Venous sinuses: Patent Anatomic variants: None Delayed phase: Normal enhancement on delayed imaging. No evidence of subarachnoid hemorrhage or mass. 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 No evidence of aneurysm rupture. No other aneurysm Negative for atherosclerotic disease or intracranial stenosis. Electronically Signed   By: Marlan Palau M.D.   On: 02/04/2019 13:44           Samuel Jester, DO 02/07/19 [phone]

## 2019-06-26 ENCOUNTER — Emergency Department (HOSPITAL_COMMUNITY): Payer: Self-pay

## 2019-06-26 ENCOUNTER — Emergency Department (HOSPITAL_COMMUNITY)
Admission: EM | Admit: 2019-06-26 | Discharge: 2019-06-26 | Disposition: A | Discharge status: Home / Self Care | Payer: Self-pay | Attending: Emergency Medicine | Admitting: Emergency Medicine

## 2019-06-26 ENCOUNTER — Other Ambulatory Visit: Payer: Self-pay

## 2019-06-26 ENCOUNTER — Encounter (HOSPITAL_COMMUNITY): Payer: Self-pay | Admitting: *Deleted

## 2019-06-26 DIAGNOSIS — T50901A Poisoning by unspecified drugs, medicaments and biological substances, accidental (unintentional), initial encounter: Secondary | ICD-10-CM

## 2019-06-26 DIAGNOSIS — F17210 Nicotine dependence, cigarettes, uncomplicated: Secondary | ICD-10-CM | POA: Insufficient documentation

## 2019-06-26 DIAGNOSIS — F1910 Other psychoactive substance abuse, uncomplicated: Secondary | ICD-10-CM | POA: Insufficient documentation

## 2019-06-26 DIAGNOSIS — F1410 Cocaine abuse, uncomplicated: Secondary | ICD-10-CM | POA: Insufficient documentation

## 2019-06-26 DIAGNOSIS — T50904A Poisoning by unspecified drugs, medicaments and biological substances, undetermined, initial encounter: Secondary | ICD-10-CM

## 2019-06-26 DIAGNOSIS — F1424 Cocaine dependence with cocaine-induced mood disorder: Secondary | ICD-10-CM

## 2019-06-26 DIAGNOSIS — T405X4A Poisoning by cocaine, undetermined, initial encounter: Secondary | ICD-10-CM

## 2019-06-26 DIAGNOSIS — T405X1A Poisoning by cocaine, accidental (unintentional), initial encounter: Secondary | ICD-10-CM | POA: Diagnosis present

## 2019-06-26 DIAGNOSIS — F1494 Cocaine use, unspecified with cocaine-induced mood disorder: Secondary | ICD-10-CM | POA: Diagnosis present

## 2019-06-26 LAB — CBC WITH DIFFERENTIAL/PLATELET
Abs Immature Granulocytes: 0.25 10*3/uL — ABNORMAL HIGH (ref 0.00–0.07)
Basophils Absolute: 0.1 10*3/uL (ref 0.0–0.1)
Basophils Relative: 0 %
Eosinophils Absolute: 0.1 10*3/uL (ref 0.0–0.5)
Eosinophils Relative: 0 %
HCT: 43.6 % (ref 36.0–46.0)
Hemoglobin: 14.4 g/dL (ref 12.0–15.0)
Immature Granulocytes: 1 %
Lymphocytes Relative: 10 %
Lymphs Abs: 1.8 10*3/uL (ref 0.7–4.0)
MCH: 32.8 pg (ref 26.0–34.0)
MCHC: 33 g/dL (ref 30.0–36.0)
MCV: 99.3 fL (ref 80.0–100.0)
Monocytes Absolute: 0.9 10*3/uL (ref 0.1–1.0)
Monocytes Relative: 5 %
Neutro Abs: 15.3 10*3/uL — ABNORMAL HIGH (ref 1.7–7.7)
Neutrophils Relative %: 84 %
Platelets: 202 10*3/uL (ref 150–400)
RBC: 4.39 MIL/uL (ref 3.87–5.11)
RDW: 13.2 % (ref 11.5–15.5)
WBC: 18.4 10*3/uL — ABNORMAL HIGH (ref 4.0–10.5)
nRBC: 0 % (ref 0.0–0.2)

## 2019-06-26 LAB — RAPID URINE DRUG SCREEN, HOSP PERFORMED
Amphetamines: POSITIVE — AB
Barbiturates: NOT DETECTED
Benzodiazepines: NOT DETECTED
Cocaine: POSITIVE — AB
Opiates: NOT DETECTED
Tetrahydrocannabinol: NOT DETECTED

## 2019-06-26 LAB — URINALYSIS, ROUTINE W REFLEX MICROSCOPIC
Bacteria, UA: NONE SEEN
Bilirubin Urine: NEGATIVE
Glucose, UA: >=500 mg/dL — AB
Ketones, ur: NEGATIVE mg/dL
Leukocytes,Ua: NEGATIVE
Nitrite: NEGATIVE
Protein, ur: NEGATIVE mg/dL
Specific Gravity, Urine: 1.013 (ref 1.005–1.030)
pH: 5 (ref 5.0–8.0)

## 2019-06-26 LAB — COMPREHENSIVE METABOLIC PANEL
ALT: 23 U/L (ref 0–44)
AST: 22 U/L (ref 15–41)
Albumin: 3.8 g/dL (ref 3.5–5.0)
Alkaline Phosphatase: 78 U/L (ref 38–126)
Anion gap: 9 (ref 5–15)
BUN: 16 mg/dL (ref 6–20)
CO2: 26 mmol/L (ref 22–32)
Calcium: 8.9 mg/dL (ref 8.9–10.3)
Chloride: 104 mmol/L (ref 98–111)
Creatinine, Ser: 1.19 mg/dL — ABNORMAL HIGH (ref 0.44–1.00)
GFR calc Af Amer: >60 mL/min (ref >60)
GFR calc non Af Amer: 54 mL/min — ABNORMAL LOW (ref >60)
Glucose, Bld: 221 mg/dL — ABNORMAL HIGH (ref 70–99)
Potassium: 3.7 mmol/L (ref 3.5–5.1)
Sodium: 139 mmol/L (ref 135–145)
Total Bilirubin: 0.4 mg/dL (ref 0.3–1.2)
Total Protein: 7.5 g/dL (ref 6.5–8.1)

## 2019-06-26 LAB — SALICYLATE LEVEL: Salicylate Lvl: <7 mg/dL (ref 2.8–30.0)

## 2019-06-26 LAB — ETHANOL: Alcohol, Ethyl (B): <10 mg/dL (ref <10)

## 2019-06-26 LAB — ACETAMINOPHEN LEVEL: Acetaminophen (Tylenol), Serum: <10 ug/mL — ABNORMAL LOW (ref 10–30)

## 2019-06-26 MED ORDER — ACETAMINOPHEN 325 MG PO TABS
650.0000 mg | ORAL_TABLET | Freq: Once | ORAL | Status: AC
  Administered 2019-06-26: 650 mg via ORAL
  Filled 2019-06-26: qty 2

## 2019-06-26 MED ORDER — SODIUM CHLORIDE 0.9 % IV BOLUS
1000.0000 mL | Freq: Once | INTRAVENOUS | Status: AC
  Administered 2019-06-26: 1000 mL via INTRAVENOUS

## 2019-06-26 MED ORDER — ONDANSETRON HCL 4 MG/2ML IJ SOLN
4.0000 mg | Freq: Once | INTRAMUSCULAR | Status: AC
  Administered 2019-06-26: 4 mg via INTRAVENOUS
  Filled 2019-06-26: qty 2

## 2019-06-26 NOTE — ED Triage Notes (Signed)
Pt was found outside by significant other, unsure of how long pt was outside, when pt was found pt had resp rate of 4, was cyanotic, unresponsive, ems gave pt 2 mg narcan with improvement in loc, resp rate, pt alert, denies any drug use, states that she does not know what happened,

## 2019-06-26 NOTE — ED Provider Notes (Addendum)
EKG Interpretation  Date/Time:  Sunday June 26 2019 08:42:46 EDT Ventricular Rate:  76 PR Interval:    QRS Duration: 94 QT Interval:  408 QTC Calculation: 459 R Axis:   39 Text Interpretation:  Sinus rhythm Probable inferior infarct, age indeterminate no QT prolongation Confirmed by Noemi Chapel [phone]) on 06/26/2019 8:46:57 AM      The patient is awake, alert, she is ambulated back and forth to the bathroom, she is tearful and frustrated about stating she does not know what happened to her last night.  She has not had any other changes in her mental status and at this time is stable for psychiatric consultation.  Repeat EKG shows no signs of QT prolongation  Psych has cleared for d/c.   Noemi Chapel, MD 06/26/19 [id number]    Noemi Chapel, MD 06/26/19 [phone]

## 2019-06-26 NOTE — BH Assessment (Signed)
Tele Assessment Note   Patient Name: Elaine Morgan MRN: [id number] Referring Physician: Randel BooksS. Rancour, MD Location of Patient: [facility] Location of Provider: Behavioral Health TTS Department  Elaine Morgan is a 48 y.o. female who presented to [facility] after being found unconscious by her significant other.  Per hospital report:  "Patient uncertain what happened.  She was brought in by EMS after being found outside minimally responsive and cyanotic.  She was given 2 mg of Narcan with improvement and is now awake and alert.  Patient denies using any drugs tonight and does not know what happened.  She admits these she uses crack from time to time but not tonight.  Denies any IV drug abuse or taking any pills.  She knows her name and shows she is at the hospital."  Pt lives in [place] with her boyfriend and her children.  She is a stay at home mother.  Pt has not been assessed by TTS before.  Pt reported as follows:  She stated that she is not sure how she came to the hospital.  She stated that she uses about $20 worth of crack cocaine on 06/25/2019.  She also acknowledged using amphetamines (unknown amount).  Pt's UDS indicated the presence of cocaine and amphetamines, and per hospital notes, a white powdery substance was found in her purse and in a ring box.   Pt denied suicidal ideation, past suicide attempt, homicidal ideation, hallucination, and self-injurious behavior.  Pt said that she was not interested in substance use treatment.  During assessment, Pt presented as quiet but alert and oriented.  She had poor eye contact (she was rolled over on her side, would not turn around).  Pt's mood and affect were euthymic.  Pt's speech was normal in rate, rhythm, and volume.  Pt's thought processes wee within normal range, and thought content was logical and goal-oriented.  There was no evidence of delusion or hallucination.  Pt's memory and concentration were intact.  Judgment was fair.  Insight and impulse control  were poor.  Author spoke with Pt's boyfriend Christeen DouglasMerle [phone] about the situation.  He stated that Pt was not attempting suicide, but that she had an accidental overdose/drug interaction.  He stated that he felt safe with her returning home, and he agreed to watch her for 24 hours.  Consulted with T. Money, NP, who determined that Pt may be psych-cleared. Diagnosis: Cocaine Use Disorder  Past Medical History:  Past Medical History:  Diagnosis Date  . Chronic right shoulder pain   . Complication of anesthesia    ' I have a hard time breathing after surgery"  . Kidney stone   . Kidney stone     Past Surgical History:  Procedure Laterality Date  . ANKLE FRACTURE SURGERY Left   . CYSTOSCOPY WITH STENT PLACEMENT Left 11/28/2013   Procedure: CYSTOSCOPY WITH STENT PLACEMENT;  Surgeon: Ky BarbanMohammad I Javaid, MD;  Location: [facility];  Service: Urology;  Laterality: Left;  . EXTRACORPOREAL SHOCK WAVE LITHOTRIPSY Left 01/11/2014   Procedure: EXTRACORPOREAL SHOCK WAVE LITHOTRIPSY (ESWL) LEFT RENAL CALCULUS;  Surgeon: Ky BarbanMohammad I Javaid, MD;  Location: [facility];  Service: Urology;  Laterality: Left;  . FRACTURE SURGERY    . ORIF MANDIBULAR FRACTURE Bilateral 10/07/2013   Procedure: BILATERAL ORIF MANDIBULAR FRACTURE INTERMAXILLARY FIXATION/EXTRACTION OF TOOTH;  Surgeon: Glenna FellowsBrinda Thimmappa, MD;  Location: [facility];  Service: Plastics;  Laterality: Bilateral;  ANESTHESIA: NASO TRACHEAL INTUBATION  . TUBAL LIGATION      Family History: No family  history on file.  Social History:  reports that she has been smoking cigarettes. She has a 50.00 pack-year smoking history. She has never used smokeless tobacco. She reports current alcohol use. She reports current drug use. Frequency: 7.00 times per week. Drugs: "Crack" cocaine and Amphetamines.  Additional Social History:  Alcohol / Drug Use Pain Medications: See MAR Prescriptions: See MAR Over the Counter: See MAR History of alcohol / drug use?: Yes Substance  #1 Name of Substance 1: Crack cocaine 1 - Amount (size/oz): $20 worth 1 - Frequency: Daily 1 - Duration: Ongoing 1 - Last Use / Amount: 06/25/2019 Substance #2 Name of Substance 2: Amphetamines 2 - Amount (size/oz): Unknown 2 - Frequency: Unknown 2 - Duration: Ongoing 2 - Last Use / Amount: 06/25/2019 Substance #3 Name of Substance 3: Alcohol 3 - Amount (size/oz): Varied 3 - Frequency: Episodic 3 - Duration: Ongoing 3 - Last Use / Amount: Unsure  CIWA: CIWA-Ar BP: 96/78 Pulse Rate: 71 COWS:    Allergies:  Allergies  Allergen Reactions  . Hydrocodone Itching and Nausea And Vomiting    Home Medications: (Not in a hospital admission)   OB/GYN Status:  Patient's last menstrual period was 01/13/2017.  General Assessment Data Location of Assessment: [facility] TTS Assessment: In system Is this a Tele or Face-to-Face Assessment?: Tele Assessment Is this an Initial Assessment or a Re-assessment for this encounter?: Initial Assessment Patient Accompanied by:: N/A Language Other than English: No Living Arrangements: Other (Comment) What gender do you identify as?: Female Marital status: Separated Pregnancy Status: No Living Arrangements: Spouse/significant other, Children Can pt return to current living arrangement?: Yes Admission Status: Voluntary Is patient capable of signing voluntary admission?: Yes Referral Source: Self/Family/Friend Insurance type: None     Crisis Care Plan Living Arrangements: Spouse/significant other, Children Name of Psychiatrist: None Name of Therapist: None  Education Status Is patient currently in school?: No Is the patient employed, unemployed or receiving disability?: Unemployed  Risk to self with the past 6 months Suicidal Ideation: No Has patient been a risk to self within the past 6 months prior to admission? : No Suicidal Intent: No Has patient had any suicidal intent within the past 6 months prior to admission? : No Is patient  at risk for suicide?: No Suicidal Plan?: No Has patient had any suicidal plan within the past 6 months prior to admission? : No Access to Means: No What has been your use of drugs/alcohol within the last 12 months?: Crack cocaine, amphetamines, heroin, alcohol Previous Attempts/Gestures: No Intentional Self Injurious Behavior: None Recent stressful life event(s): Other (Comment)(Denied) Persecutory voices/beliefs?: No Depression: No Substance abuse history and/or treatment for substance abuse?: Yes Suicide prevention information given to non-admitted patients: Not applicable  Risk to Others within the past 6 months Homicidal Ideation: No Does patient have any lifetime risk of violence toward others beyond the six months prior to admission? : No Thoughts of Harm to Others: No Current Homicidal Intent: No Current Homicidal Plan: No Access to Homicidal Means: No History of harm to others?: No Assessment of Violence: None Noted Does patient have access to weapons?: No Criminal Charges Pending?: No Does patient have a court date: No  Psychosis Hallucinations: None noted Delusions: None noted  Mental Status Report Appearance/Hygiene: Unremarkable, In hospital gown Eye Contact: Poor Motor Activity: Freedom of movement, Unremarkable Speech: Logical/coherent Level of Consciousness: Quiet/awake Mood: Euthymic Affect: Appropriate to circumstance Anxiety Level: None Thought Processes: Coherent, Relevant Judgement: Partial Orientation: Place, Person, Time, Situation Obsessive  Compulsive Thoughts/Behaviors: None  Cognitive Functioning Concentration: Normal Memory: Recent Intact, Remote Intact Is patient IDD: No Insight: Poor Impulse Control: Poor Appetite: Fair Have you had any weight changes? : No Change Sleep: No Change Vegetative Symptoms: None  ADLScreening [facility] Assessment Services) Patient's cognitive ability adequate to safely complete daily activities?: Yes Patient  able to express need for assistance with ADLs?: Yes Independently performs ADLs?: Yes (appropriate for developmental age)  Prior Inpatient Therapy Prior Inpatient Therapy: No  Prior Outpatient Therapy Prior Outpatient Therapy: No Does patient have an ACCT team?: No Does patient have Intensive In-House Services?  : No Does patient have [facility] services? : No Does patient have P4CC services?: No  ADL Screening (condition at time of admission) Patient's cognitive ability adequate to safely complete daily activities?: Yes Is the patient deaf or have difficulty hearing?: No Does the patient have difficulty seeing, even when wearing glasses/contacts?: No Does the patient have difficulty concentrating, remembering, or making decisions?: No Patient able to express need for assistance with ADLs?: Yes Does the patient have difficulty dressing or bathing?: No Independently performs ADLs?: Yes (appropriate for developmental age) Does the patient have difficulty walking or climbing stairs?: No Weakness of Legs: None Weakness of Arms/Hands: None  Home Assistive Devices/Equipment Home Assistive Devices/Equipment: None  Therapy Consults (therapy consults require a physician order) PT Evaluation Needed: No OT Evalulation Needed: No SLP Evaluation Needed: No Abuse/Neglect Assessment (Assessment to be complete while patient is alone) Abuse/Neglect Assessment Can Be Completed: Yes Physical Abuse: Denies Verbal Abuse: Denies Sexual Abuse: Denies Exploitation of patient/patient's resources: Denies Values / Beliefs Cultural Requests During Hospitalization: None Spiritual Requests During Hospitalization: None Consults Spiritual Care Consult Needed: No Social Work Consult Needed: No Regulatory affairs officer (For Healthcare) Does Patient Have a Medical Advance Directive?: No          Disposition:  Disposition Initial Assessment Completed for this Encounter: Yes Disposition of Patient:  Discharge(Per T. Money, NP, Pt does not meet inpt criteria)  This service was provided via telemedicine using a 2-way, interactive audio and Radiographer, therapeutic.  Names of all persons participating in this telemedicine service and their role in this encounter. Name: Wardell,Raffaela Role: Pt  Name: Kerin Ransom Role: Boyfriend          Marlowe Aschoff 06/26/2019 8:50 AM

## 2019-06-26 NOTE — Consult Note (Signed)
Telepsych Consultation   Reason for Consult:  Drug overdose Referring Physician:  EDP Location of Patient:  Location of Provider: [facility] TTS Department  Patient Identification: Elaine Morgan MRN:  [id number] Principal Diagnosis: Cocaine-induced mood disorder (HCC) Diagnosis:  Principal Problem:   Cocaine-induced mood disorder (HCC) Active Problems:   Overdose of cocaine (HCC)   Total Time spent with patient: 30 minutes  Subjective:   Elaine Morgan is a 48 y.o. female patient reports that she is feeling better today.  Patient reports that she accidentally used too much cocaine yesterday and does not remember a lot about after using the cocaine.  She denies any suicidal or homicidal ideations and denies any hallucinations.  Patient also denies this being an intentional suicide attempt or an intentional overdose.  She reports that she uses cocaine daily and has been to multiple rehabs in the past, approximately 8 times.  She states that she does not need any assistance with her substance abuse.  She denies any depression or anxiety prior to using her substances.  She states that she lives with her boyfriend who is supportive and he does not use any drugs.  Patient also states and understands that if she continues using drugs and the way she has been that this can and will kill her.  HPI:  EDP note:48 y.o. female. Level 5 caveat for altered mental status.  Patient uncertain what happened.  She was brought in by EMS after being found outside minimally responsive and cyanotic.  She was given 2 mg of Narcan with improvement and is now awake and alert.  Patient denies using any drugs tonight and does not know what happened.  She admits these she uses crack from time to time but not tonight.  Denies any IV drug abuse or taking any pills.  She knows her name and shows she is at the hospital.  She denies any head, neck, back, chest or abdominal pain.  No focal weakness, numbness or tingling.   Denies any suicidal homicidal thoughts  Patient is seen by me via tele-psych and I have consulted with Dr. Lucianne MussKumar.  Patient has denied any suicidal or homicidal ideations and denies any hallucinations.  Patient does admit to having a cocaine dependency issue.  Collateral information was gained by TTS from the patient's boyfriend who lives with her.  It was documented in the note that the patient has no safety concerns with the patient being discharged home note the patient does use cocaine but this was not an intentional overdose nor was there any suicidal comments or threats made at the time of the drug use or prior to that.  At this time the patient does not meet inpatient criteria and is psychiatrically cleared.  I have contacted Dr. Hyacinth MeekerMiller and notified him of the recommendations.  Past Psychiatric History: substance abuse, last substance rehab in 2017, reports multiple substance abuse treatments (about 8 per report from patient)  Risk to Self: Suicidal Ideation: No Suicidal Intent: No Is patient at risk for suicide?: No Suicidal Plan?: No Access to Means: No What has been your use of drugs/alcohol within the last 12 months?: Crack cocaine, amphetamines, heroin, alcohol Intentional Self Injurious Behavior: None Risk to Others: Homicidal Ideation: No Thoughts of Harm to Others: No Current Homicidal Intent: No Current Homicidal Plan: No Access to Homicidal Means: No History of harm to others?: No Assessment of Violence: None Noted Does patient have access to weapons?: No Criminal Charges Pending?: No Does patient have  a court date: No Prior Inpatient Therapy: Prior Inpatient Therapy: No Prior Outpatient Therapy: Prior Outpatient Therapy: No Does patient have an ACCT team?: No Does patient have Intensive In-House Services?  : No Does patient have [facility] services? : No Does patient have P4[facility] services?: No  Past Medical History:  Past Medical History:  Diagnosis Date  . Chronic right  shoulder pain   . Complication of anesthesia    ' I have a hard time breathing after surgery"  . Kidney stone   . Kidney stone     Past Surgical History:  Procedure Laterality Date  . ANKLE FRACTURE SURGERY Left   . CYSTOSCOPY WITH STENT PLACEMENT Left 11/28/2013   Procedure: CYSTOSCOPY WITH STENT PLACEMENT;  Surgeon: Marissa Nestle, MD;  Location: [facility];  Service: Urology;  Laterality: Left;  . EXTRACORPOREAL SHOCK WAVE LITHOTRIPSY Left 01/11/2014   Procedure: EXTRACORPOREAL SHOCK WAVE LITHOTRIPSY (ESWL) LEFT RENAL CALCULUS;  Surgeon: Marissa Nestle, MD;  Location: [facility];  Service: Urology;  Laterality: Left;  . FRACTURE SURGERY    . ORIF MANDIBULAR FRACTURE Bilateral 10/07/2013   Procedure: BILATERAL ORIF MANDIBULAR FRACTURE INTERMAXILLARY FIXATION/EXTRACTION OF TOOTH;  Surgeon: Irene Limbo, MD;  Location: [facility];  Service: Plastics;  Laterality: Bilateral;  ANESTHESIA: NASO TRACHEAL INTUBATION  . TUBAL LIGATION     Family History: No family history on file. Family Psychiatric  History: Denies Social History:  Social History   Substance and Sexual Activity  Alcohol Use Yes   Comment: Varied     Social History   Substance and Sexual Activity  Drug Use Yes  . Frequency: 7.0 times per week  . Types: "Crack" cocaine, Amphetamines   Comment: Daily use of crack cocaine    Social History   Socioeconomic History  . Marital status: Legally Separated    Spouse name: Not on file  . Number of children: Not on file  . Years of education: Not on file  . Highest education level: Not on file  Occupational History  . Occupation: Stay at home mother  Social Needs  . Financial resource strain: Not on file  . Food insecurity    Worry: Not on file    Inability: Not on file  . Transportation needs    Medical: Not on file    Non-medical: Not on file  Tobacco Use  . Smoking status: Heavy Tobacco Smoker    Packs/day: 2.00    Years: 25.00    Pack years: 50.00    Types:  Cigarettes  . Smokeless tobacco: Never Used  Substance and Sexual Activity  . Alcohol use: Yes    Comment: Varied  . Drug use: Yes    Frequency: 7.0 times per week    Types: "Crack" cocaine, Amphetamines    Comment: Daily use of crack cocaine  . Sexual activity: Yes    Birth control/protection: Surgical  Lifestyle  . Physical activity    Days per week: Not on file    Minutes per session: Not on file  . Stress: Not on file  Relationships  . Social Herbalist on phone: Not on file    Gets together: Not on file    Attends religious service: Not on file    Active member of club or organization: Not on file    Attends meetings of clubs or organizations: Not on file    Relationship status: Not on file  Other Topics Concern  . Not on file  Social History Narrative  [place] lives with [place] in [place].  She is a stay at home mother.   Additional Social History:    Allergies:   Allergies  Allergen Reactions  . Hydrocodone Itching and Nausea And Vomiting    Labs:  Results for orders placed or performed during the hospital encounter of 06/26/19 (from the past 48 hour(s))  Rapid urine drug screen (hospital performed)     Status: Abnormal   Collection Time: 06/26/19  2:53 AM  Result Value Ref Range   Opiates NONE DETECTED NONE DETECTED   Cocaine POSITIVE (A) NONE DETECTED   Benzodiazepines NONE DETECTED NONE DETECTED   Amphetamines POSITIVE (A) NONE DETECTED   Tetrahydrocannabinol NONE DETECTED NONE DETECTED   Barbiturates NONE DETECTED NONE DETECTED    Comment: (NOTE) DRUG SCREEN FOR MEDICAL PURPOSES ONLY.  IF CONFIRMATION IS NEEDED FOR ANY PURPOSE, NOTIFY LAB WITHIN 5 DAYS. LOWEST DETECTABLE LIMITS FOR URINE DRUG SCREEN Drug Class                     Cutoff (ng/mL) Amphetamine and metabolites    1000 Barbiturate and metabolites    200 Benzodiazepine                 200 Tricyclics and metabolites     300 Opiates and metabolites        300 Cocaine and  metabolites        300 THC                            50 Performed at [facility], [address] [place], [place] [address]   Urinalysis, Routine w reflex microscopic     Status: Abnormal   Collection Time: 06/26/19  2:53 AM  Result Value Ref Range   Color, Urine YELLOW YELLOW   APPearance CLEAR CLEAR   Specific Gravity, Urine 1.013 1.005 - 1.030   pH 5.0 5.0 - 8.0   Glucose, UA >=500 (A) NEGATIVE mg/dL   Hgb urine dipstick SMALL (A) NEGATIVE   Bilirubin Urine NEGATIVE NEGATIVE   Ketones, ur NEGATIVE NEGATIVE mg/dL   Protein, ur NEGATIVE NEGATIVE mg/dL   Nitrite NEGATIVE NEGATIVE   Leukocytes,Ua NEGATIVE NEGATIVE   RBC / HPF 0-5 0 - 5 RBC/hpf   WBC, UA 0-5 0 - 5 WBC/hpf   Bacteria, UA NONE SEEN NONE SEEN   Squamous Epithelial / LPF 0-5 0 - 5   Mucus PRESENT    Hyaline Casts, UA PRESENT     Comment: Performed at [facility], [address] [place], [place] [address]  CBC with Differential/Platelet     Status: Abnormal   Collection Time: 06/26/19  3:03 AM  Result Value Ref Range   WBC 18.4 (H) 4.0 - 10.5 K/uL   RBC 4.39 3.87 - 5.11 MIL/uL   Hemoglobin 14.4 12.0 - 15.0 g/dL   HCT 03.443.6 74.236.0 - 59.546.0 %   MCV 99.3 80.0 - 100.0 fL   MCH 32.8 26.0 - 34.0 pg   MCHC 33.0 30.0 - 36.0 g/dL   RDW 63.813.2 75.611.5 - 43.315.5 %   Platelets 202 150 - 400 K/uL   nRBC 0.0 0.0 - 0.2 %   Neutrophils Relative % 84 %   Neutro Abs 15.3 (H) 1.7 - 7.7 K/uL   Lymphocytes Relative 10 %   Lymphs Abs 1.8 0.7 - 4.0 K/uL   Monocytes Relative 5 %   Monocytes Absolute 0.9 0.1 - 1.0 K/uL  Eosinophils Relative 0 %   Eosinophils Absolute 0.1 0.0 - 0.5 K/uL   Basophils Relative 0 %   Basophils Absolute 0.1 0.0 - 0.1 K/uL   Immature Granulocytes 1 %   Abs Immature Granulocytes 0.25 (H) 0.00 - 0.07 K/uL    Comment: Performed at [facility], [address] [place], [place] [address]  Comprehensive metabolic panel     Status: Abnormal   Collection Time: 06/26/19  3:03 AM  Result Value Ref Range   Sodium 139 135  - 145 mmol/L   Potassium 3.7 3.5 - 5.1 mmol/L   Chloride 104 98 - 111 mmol/L   CO2 26 22 - 32 mmol/L   Glucose, Bld 221 (H) 70 - 99 mg/dL   BUN 16 6 - 20 mg/dL   Creatinine, Ser 6.04 (H) 0.44 - 1.00 mg/dL   Calcium 8.9 8.9 - 54.0 mg/dL   Total Protein 7.5 6.5 - 8.1 g/dL   Albumin 3.8 3.5 - 5.0 g/dL   AST 22 15 - 41 U/L   ALT 23 0 - 44 U/L   Alkaline Phosphatase 78 38 - 126 U/L   Total Bilirubin 0.4 0.3 - 1.2 mg/dL   GFR calc non Af Amer 54 (L) >60 mL/min   GFR calc Af Amer >60 >60 mL/min   Anion gap 9 5 - 15    Comment: Performed at [facility], [address] [place], [place] [address]  Ethanol     Status: None   Collection Time: 06/26/19  3:03 AM  Result Value Ref Range   Alcohol, Ethyl (B) <10 <10 mg/dL    Comment: (NOTE) Lowest detectable limit for serum alcohol is 10 mg/dL. For medical purposes only. Performed at [facility], [address], [place], [place] [address]   Acetaminophen level     Status: Abnormal   Collection Time: 06/26/19  3:03 AM  Result Value Ref Range   Acetaminophen (Tylenol), Serum <10 (L) 10 - 30 ug/mL    Comment: (NOTE) Therapeutic concentrations vary significantly. A range of 10-30 ug/mL  may be an effective concentration for many patients. However, some  are best treated at concentrations outside of this range. Acetaminophen concentrations >150 ug/mL at 4 hours after ingestion  and >50 ug/mL at 12 hours after ingestion are often associated with  toxic reactions. Performed at [facility], [address] [place], [place] [address]   Salicylate level     Status: None   Collection Time: 06/26/19  3:03 AM  Result Value Ref Range   Salicylate Lvl <7.0 2.8 - 30.0 mg/dL    Comment: Performed at [facility], [address], [place], [place] [address]    Medications:  No current facility-administered medications for this encounter.    No current outpatient medications on file.    Musculoskeletal: Strength & Muscle Tone: within normal  limits Gait & Station: remained in bed during evaluation Patient leans: N/A  Psychiatric Specialty Exam: Physical Exam  Nursing note and vitals reviewed. Constitutional: She is oriented to person, place, and time. She appears well-developed and well-nourished.  Cardiovascular: Normal rate.  Respiratory: Effort normal.  Musculoskeletal: Normal range of motion.  Neurological: She is alert and oriented to person, place, and time.  Skin: Skin is warm.    Review of Systems  Constitutional: Negative.   HENT: Negative.   Eyes: Negative.   Respiratory: Negative.   Cardiovascular: Negative.   Gastrointestinal: Negative.   Genitourinary: Negative.   Musculoskeletal: Negative.   Skin: Negative.  Neurological: Negative.   Endo/Heme/Allergies: Negative.   Psychiatric/Behavioral: Positive for substance abuse.    Blood pressure (!) 139/101, pulse 78, temperature 98.1 F (36.7 C), temperature source Oral, resp. rate 12, height 5\' 7"  (1.702 m), weight 99.8 kg, last menstrual period 01/13/2017, SpO2 99 %.Body mass index is 34.46 kg/m.  General Appearance: Disheveled  Eye Contact:  Minimal  Speech:  Clear and Coherent and Normal Rate  Volume:  Normal  Mood:  Euthymic  Affect:  Flat  Thought Process:  Coherent and Descriptions of Associations: Intact  Orientation:  Full (Time, Place, and Person)  Thought Content:  WDL  Suicidal Thoughts:  No  Homicidal Thoughts:  No  Memory:  Immediate;   Good Recent;   Poor Remote;   Good  Judgement:  Poor  Insight:  Good  Psychomotor Activity:  Normal  Concentration:  Concentration: Good and Attention Span: Good  Recall:  Good  Fund of Knowledge:  Good  Language:  Good  Akathisia:  No  Handed:  Right  AIMS (if indicated):     Assets:  Communication Skills Desire for Improvement Resilience Social Support Transportation  ADL's:  Intact  Cognition:  WNL  Sleep:        Treatment Plan Summary: Follow up with outpatient provider   Recommend to seek substance abuse treatment, but patient refused Cessation of any substances not prescribed  Disposition: No evidence of imminent risk to self or others at present.   Patient does not meet criteria for psychiatric inpatient admission. Supportive therapy provided about ongoing stressors. Discussed crisis plan, support from social network, calling 911, coming to the Emergency Department, and calling Suicide Hotline.  This service was provided via telemedicine using a 2-way, interactive audio and video technology.  Names of all persons participating in this telemedicine service and their role in this encounter. Name: Elaine Morgan Alvira Role: Patient  Name: Reola Calkinsravis Kody Brandl NP Role: Provider  Name:  Role:   Name:  Role:     Maryfrances Bunnellravis B Ida Milbrath, FNP 06/26/2019 9:16 AM

## 2019-06-26 NOTE — ED Provider Notes (Signed)
[facility] EMERGENCY DEPARTMENT Provider Note   CSN: [id number] Arrival date & time: 06/26/19  1761     History   Chief Complaint Chief Complaint  Patient presents with  . Drug Overdose    HPI Elaine Morgan is a 48 y.o. female.     Level 5 caveat for altered mental status.  Patient uncertain what happened.  She was brought in by EMS after being found outside minimally responsive and cyanotic.  She was given 2 mg of Narcan with improvement and is now awake and alert.  Patient denies using any drugs tonight and does not know what happened.  She admits these she uses crack from time to time but not tonight.  Denies any IV drug abuse or taking any pills.  She knows her name and shows she is at the hospital.  She denies any head, neck, back, chest or abdominal pain.  No focal weakness, numbness or tingling.  Denies any suicidal homicidal thoughts  The history is provided by the patient.  Drug Overdose    Past Medical History:  Diagnosis Date  . Chronic right shoulder pain   . Complication of anesthesia    ' I have a hard time breathing after surgery"  . Kidney stone   . Kidney stone     Patient Active Problem List   Diagnosis Date Noted  . URTI (acute upper respiratory infection) 11/27/2013  . Nephrolithiasis 11/27/2013  . UTI (lower urinary tract infection) 11/27/2013  . Dehydration 11/27/2013  . Kidney stone 11/27/2013    Past Surgical History:  Procedure Laterality Date  . ANKLE FRACTURE SURGERY Left   . CYSTOSCOPY WITH STENT PLACEMENT Left 11/28/2013   Procedure: CYSTOSCOPY WITH STENT PLACEMENT;  Surgeon: Marissa Nestle, MD;  Location: [facility];  Service: Urology;  Laterality: Left;  . EXTRACORPOREAL SHOCK WAVE LITHOTRIPSY Left 01/11/2014   Procedure: EXTRACORPOREAL SHOCK WAVE LITHOTRIPSY (ESWL) LEFT RENAL CALCULUS;  Surgeon: Marissa Nestle, MD;  Location: [facility];  Service: Urology;  Laterality: Left;  . FRACTURE SURGERY    . ORIF MANDIBULAR FRACTURE Bilateral  10/07/2013   Procedure: BILATERAL ORIF MANDIBULAR FRACTURE INTERMAXILLARY FIXATION/EXTRACTION OF TOOTH;  Surgeon: Irene Limbo, MD;  Location: [facility];  Service: Plastics;  Laterality: Bilateral;  ANESTHESIA: NASO TRACHEAL INTUBATION  . TUBAL LIGATION       OB History    Gravida  3   Para  3   Term  3   Preterm      AB      Living  3     SAB      TAB      Ectopic      Multiple      Live Births               Home Medications    Prior to Admission medications   Not on File    Family History No family history on file.  Social History Social History   Tobacco Use  . Smoking status: Heavy Tobacco Smoker    Packs/day: 2.00    Years: 25.00    Pack years: 50.00    Types: Cigarettes  . Smokeless tobacco: Never Used  Substance Use Topics  . Alcohol use: No  . Drug use: No     Allergies   Hydrocodone   Review of Systems Review of Systems  Unable to perform ROS: Mental status change     Physical Exam Updated Vital Signs BP 96/78   Pulse 71  Resp 18   Ht 5\' 7"  (1.702 m)   Wt 99.8 kg   LMP 01/13/2017   SpO2 94%   BMI 34.46 kg/m   Physical Exam Vitals signs and nursing note reviewed.  Constitutional:      General: She is not in acute distress.    Appearance: Normal appearance. She is well-developed and normal weight. She is not ill-appearing.     Comments: Oriented to person and place  HENT:     Head: Normocephalic and atraumatic.     Nose: Nose normal. No congestion or rhinorrhea.     Mouth/Throat:     Mouth: Mucous membranes are moist.     Pharynx: No oropharyngeal exudate.  Eyes:     Conjunctiva/sclera: Conjunctivae normal.     Pupils: Pupils are equal, round, and reactive to light.  Neck:     Musculoskeletal: Normal range of motion and neck supple.     Comments: No meningismus. Cardiovascular:     Rate and Rhythm: Normal rate and regular rhythm.     Heart sounds: Normal heart sounds. No murmur.  Pulmonary:     Effort:  Pulmonary effort is normal. No respiratory distress.     Breath sounds: Normal breath sounds.  Chest:     Chest wall: No tenderness.  Abdominal:     Palpations: Abdomen is soft.     Tenderness: There is no abdominal tenderness. There is no guarding or rebound.  Musculoskeletal: Normal range of motion.        General: No tenderness.  Skin:    General: Skin is warm.     Capillary Refill: Capillary refill takes less than 2 seconds.  Neurological:     General: No focal deficit present.     Mental Status: She is alert and oriented to person, place, and time. Mental status is at baseline.     Cranial Nerves: No cranial nerve deficit.     Motor: No abnormal muscle tone.     Coordination: Coordination normal.     Comments:  5/5 strength throughout. CN 2-12 intact.Equal grip strength.   Psychiatric:        Behavior: Behavior normal.      ED Treatments / Results  Labs (all labs ordered are listed, but only abnormal results are displayed) Labs Reviewed  CBC WITH DIFFERENTIAL/PLATELET - Abnormal; Notable for the following components:      Result Value   WBC 18.4 (*)    Neutro Abs 15.3 (*)    Abs Immature Granulocytes 0.25 (*)    All other components within normal limits  COMPREHENSIVE METABOLIC PANEL - Abnormal; Notable for the following components:   Glucose, Bld 221 (*)    Creatinine, Ser 1.19 (*)    GFR calc non Af Amer 54 (*)    All other components within normal limits  ACETAMINOPHEN LEVEL - Abnormal; Notable for the following components:   Acetaminophen (Tylenol), Serum <10 (*)    All other components within normal limits  RAPID URINE DRUG SCREEN, HOSP PERFORMED - Abnormal; Notable for the following components:   Cocaine POSITIVE (*)    Amphetamines POSITIVE (*)    All other components within normal limits  URINALYSIS, ROUTINE W REFLEX MICROSCOPIC - Abnormal; Notable for the following components:   Glucose, UA >=500 (*)    Hgb urine dipstick SMALL (*)    All other  components within normal limits  ETHANOL  SALICYLATE LEVEL    EKG EKG Interpretation  Date/Time:  Sunday June 26 2019 03:26:05  EDT Ventricular Rate:  94 PR Interval:    QRS Duration: 97 QT Interval:  400 QTC Calculation: 501 R Axis:   8 Text Interpretation:  Sinus rhythm Borderline T wave abnormalities Borderline prolonged QT interval Baseline wander in lead(s) V6 Nonspecific T wave abnormality Confirmed by Glynn Octaveancour, Jimmy Plessinger [phone]([id number]) on 06/26/2019 3:44:18 AM   Radiology Ct Head Wo Contrast  Result Date: 06/26/2019 CLINICAL DATA:  Altered level of consciousness. Found down. EXAM: CT HEAD WITHOUT CONTRAST TECHNIQUE: Contiguous axial images were obtained from the base of the skull through the vertex without intravenous contrast. COMPARISON:  Head CT dated 02/04/2019. FINDINGS: Brain: Ventricles are normal in size. All areas of the brain demonstrate appropriate gray-white matter attenuation. There is no mass, hemorrhage, edema or other evidence of acute parenchymal abnormality. No extra-axial hemorrhage. Vascular: No hyperdense vessel or unexpected calcification. Skull: Normal. Negative for fracture or focal lesion. Sinuses/Orbits: No acute finding. Other: None. IMPRESSION: Negative head CT. No intracranial mass, hemorrhage or edema. No skull fracture. Electronically Signed   By: Bary RichardStan  Maynard M.D.   On: 06/26/2019 05:09    Procedures Procedures (including critical care time)  Medications Ordered in ED Medications - No data to display   Initial Impression / Assessment and Plan / ED Course  I have reviewed the triage vital signs and the nursing notes.  Pertinent labs & imaging results that were available during my care of the patient were reviewed by me and considered in my medical decision making (see chart for details).       Patient from home with altered mental status.  She was found outside by significant other under unclear circumstances.  She was cyanotic and unresponsive and  was given Narcan.  She is now awake and alert and answers questions appropriately.  She is oriented to person and place.  She is asked if she did drugs last night and she states that "I must have".  She admits to cocaine use and crack use but denies any other ingestions.  Denies IV drug abuse.  Her work-up is reassuring.  Ethanol level is negative.  Acetaminophen and salicylate levels are negative.  Anion gap is normal.  CT head is stable and unchanged EKG shows slightly prolonged QT will be repeated.  At 7 AM, patient is awake and answering questions appropriately.  She is oriented to person and place.  She does not recall the events of last night.  She denies any suicidal or homicidal thoughts.  She does admit to cocaine use.  She is medically clear for TTS evaluation.  Final Clinical Impressions(s) / ED Diagnoses   Final diagnoses:  None    ED Discharge Orders    None       Terrence Pizana, Jeannett SeniorStephen, MD 06/26/19 [phone]

## 2019-06-26 NOTE — Discharge Instructions (Signed)
It is clear that you were abusing drugs last night and that is the reason that you are almost stopped breathing.  This will kill you if you keep doing this, please make every effort to stop using drugs.  I will include a list of resources for substance abuse.  I highly encourage you to call the numbers to make an appointment and to be seen for drug abuse counseling and treatment.  You may return to the emergency department if you choose or if you need help with other problems, suicidal ideation or ongoing drug abuse.  Substance Abuse Treatment Programs  Intensive Outpatient Programs [facility]     [address]      [place], [place]                   [phone]       [facility] [address] [place], [place] [phone]  [facility]     (Inpatient and outpatient)     [address]           [phone]    [facility] [phone] (Suboxone and Methadone)  [address]      [place], [place] [address]      [phone]       [address] [place], [place] [phone]  [facility] (Outpatient/Inpatient, Chemical)    (insurance only) [phone]             [facility] (Groups & Residential) [place], [place] [phone]     [facility]     [address]     [place], [place]      [phone]       [facility] (for caregivers and family) [phone] [address] [place], [place] [phone]      Residential Treatment Programs [facility]      [address], [place], [place] [address]  [phone]       [facility] [address] [place], [place] [address] [phone]  [facility]        [phone]       [facility] [phone]  [facility] ([facility]             [address]                                         [place], [place]                                                  [phone] or [phone]                               [facility] [place] [address] [place], [address] [phone]  [facility]    [address]      [place], [place]     [phone]       [facility] [address] [place], [place] [phone]  [facility]   [address]     [place], [place] [address]     [phone]  Admissions: 8am-3pm M-F  [facility] (RTS) [address] [place], [place] [phone]  [facility]: Residential Program ([address]90 Days)   [place], [place]      [phone] or [phone]     ADATC: [facility] [place], [place] (Walk in Hours over the weekend or by referral)  [facility] [address], [place], [place] [address] [phone]  Crisis Mobile: [facility]:  [phone] (for crisis response 24 hours a day) [facility] Hotline:      [phone] Outpatient Psychiatry and Counseling  [facility]: Mobile Crisis Management 24 hours:  [phone]  [facility] sliding scale fee and walk in schedule: M-F 8am-12pm/1pm-3pm [address]  [place], [place] [address] [phone]  [facility] [address] [place], [place] [address] [phone]  [facility] ([facility])- new patient walk-in appointments available Monday - Friday 8am -3pm.          [address] [place], [place] [address] [phone] or crisis line- [phone]  [facility]/ Intensive Outpatient Therapy Program [address] [place], [place] [address] [phone]  [facility]                  Crisis Services      [phone]      [address]     [place], [place] [address]                 [facility] [place] [facility]   [facility] [phone] [address] [place], [place] [address]   [facility]          [address] Laura,  [place], [place] [address]       [phone]  [facility] [address] [place], [place] [address] [phone]  [facility]    [address]    [place], [place] [address]     [phone]       Andee PolesParish McKinney, MD     [address]     [place] [address]     [phone]       [facility] [address] [place] [address]  [facility]     [address]     [place], [place]      [phone]       [facility] Eulogio DitchShamsher Ahluwalia, MD [address] [place], [place] [address] [phone]  [facility]     [address]     [place], [place] [address]     [phone]       [facility] [address] [place], [place] [address] [phone] Resources for Temporary Residential Assistance/Crisis Centers  [facility]) M-F 8am-3pm   [address] [place], [place] [address]   [phone] Services include: laundry, barbering, support groups, case management, phone  & computer access, showers, AA/NA mtgs, mental health/substance abuse nurse, job skills class, disability information, VA assistance, spiritual classes, etc.   HOMELESS SHELTERS  [facility]     [facility]   [address], [place]     [phone]              [facility] (women and children)       [address] [place], [place]  [address] [phone] [email or web address] for application and process Application Required  [facility]   [address]    [place] [address]     [phone]                    [place], [place] [address] [phone]([email or web address]  application appt.) Application Required  [facility] (women only)    [address]     [place], [place] [address]     [phone]      Intake starts 6pm daily Need valid ID, SSC, & Police report [employer] [address] [place], [place] [phone] Application Required  [employer] (men only)     [place].      [place], [place]       [place] (Pregnant women only) [address] [place], [place]  [facility]      [place] [address].      [place], [place] [address]     [phone]             [facility] [address] [place], [place] 90 day commitment/SA/Application process  [facility](men only)     [address]     [place]       Check-in at [facility] [address] [place], [place] [address] [phone] Men/Women/Women and Children must be there by 7 pm  [place], [place]

## 2019-06-26 NOTE — ED Notes (Addendum)
Small amout of white powdery substance removed from pt possession by security and disposed of in Pension scheme manager by security, [employer].  Powder contained in small ring box.  Empty box disposed in sharps container. Wittnessed by Jerilee Hoh RN.

## 2022-05-29 ENCOUNTER — Emergency Department (HOSPITAL_COMMUNITY)
Admission: EM | Admit: 2022-05-29 | Discharge: 2022-05-29 | Disposition: A | Discharge status: Home / Self Care | Payer: Self-pay | Attending: Emergency Medicine | Admitting: Emergency Medicine

## 2022-05-29 ENCOUNTER — Encounter (HOSPITAL_COMMUNITY): Payer: Self-pay

## 2022-05-29 ENCOUNTER — Emergency Department (HOSPITAL_COMMUNITY): Payer: Self-pay

## 2022-05-29 ENCOUNTER — Other Ambulatory Visit: Payer: Self-pay

## 2022-05-29 DIAGNOSIS — J9601 Acute respiratory failure with hypoxia: Secondary | ICD-10-CM | POA: Insufficient documentation

## 2022-05-29 DIAGNOSIS — T50901A Poisoning by unspecified drugs, medicaments and biological substances, accidental (unintentional), initial encounter: Secondary | ICD-10-CM

## 2022-05-29 LAB — I-STAT CHEM 8, ED
BUN: 13 mg/dL (ref 6–20)
Calcium, Ion: 1.23 mmol/L (ref 1.15–1.40)
Chloride: 105 mmol/L (ref 98–111)
Creatinine, Ser: 0.8 mg/dL (ref 0.44–1.00)
Glucose, Bld: 175 mg/dL — ABNORMAL HIGH (ref 70–99)
HCT: 46 % (ref 36.0–46.0)
Hemoglobin: 15.6 g/dL — ABNORMAL HIGH (ref 12.0–15.0)
Potassium: 3.5 mmol/L (ref 3.5–5.1)
Sodium: 142 mmol/L (ref 135–145)
TCO2: 25 mmol/L (ref 22–32)

## 2022-05-29 LAB — COMPREHENSIVE METABOLIC PANEL
ALT: 15 U/L (ref 0–44)
AST: 18 U/L (ref 15–41)
Albumin: 3.9 g/dL (ref 3.5–5.0)
Alkaline Phosphatase: 99 U/L (ref 38–126)
Anion gap: 10 (ref 5–15)
BUN: 14 mg/dL (ref 6–20)
CO2: 24 mmol/L (ref 22–32)
Calcium: 8.7 mg/dL — ABNORMAL LOW (ref 8.9–10.3)
Chloride: 106 mmol/L (ref 98–111)
Creatinine, Ser: 0.84 mg/dL (ref 0.44–1.00)
GFR, Estimated: >60 mL/min (ref >60)
Glucose, Bld: 180 mg/dL — ABNORMAL HIGH (ref 70–99)
Potassium: 3.4 mmol/L — ABNORMAL LOW (ref 3.5–5.1)
Sodium: 140 mmol/L (ref 135–145)
Total Bilirubin: 0.2 mg/dL — ABNORMAL LOW (ref 0.3–1.2)
Total Protein: 7.9 g/dL (ref 6.5–8.1)

## 2022-05-29 LAB — CBC WITH DIFFERENTIAL/PLATELET
Abs Immature Granulocytes: 0 10*3/uL (ref 0.00–0.07)
Basophils Absolute: 0.1 10*3/uL (ref 0.0–0.1)
Basophils Relative: 1 %
Eosinophils Absolute: 0.4 10*3/uL (ref 0.0–0.5)
Eosinophils Relative: 3 %
HCT: 45.6 % (ref 36.0–46.0)
Hemoglobin: 15 g/dL (ref 12.0–15.0)
Lymphocytes Relative: 63 %
Lymphs Abs: 8.9 10*3/uL — ABNORMAL HIGH (ref 0.7–4.0)
MCH: 32.1 pg (ref 26.0–34.0)
MCHC: 32.9 g/dL (ref 30.0–36.0)
MCV: 97.4 fL (ref 80.0–100.0)
Monocytes Absolute: 0.8 10*3/uL (ref 0.1–1.0)
Monocytes Relative: 6 %
Neutro Abs: 3.8 10*3/uL (ref 1.7–7.7)
Neutrophils Relative %: 27 %
Platelets: 236 10*3/uL (ref 150–400)
RBC: 4.68 MIL/uL (ref 3.87–5.11)
RDW: 12.8 % (ref 11.5–15.5)
WBC: 14.1 10*3/uL — ABNORMAL HIGH (ref 4.0–10.5)
nRBC: 0 % (ref 0.0–0.2)

## 2022-05-29 LAB — URINALYSIS, ROUTINE W REFLEX MICROSCOPIC
Bilirubin Urine: NEGATIVE
Glucose, UA: 50 mg/dL — AB
Hgb urine dipstick: NEGATIVE
Ketones, ur: NEGATIVE mg/dL
Leukocytes,Ua: NEGATIVE
Nitrite: NEGATIVE
Protein, ur: NEGATIVE mg/dL
Specific Gravity, Urine: 1.011 (ref 1.005–1.030)
pH: 6 (ref 5.0–8.0)

## 2022-05-29 LAB — SALICYLATE LEVEL: Salicylate Lvl: <7 mg/dL — ABNORMAL LOW (ref 7.0–30.0)

## 2022-05-29 LAB — RAPID URINE DRUG SCREEN, HOSP PERFORMED
Amphetamines: POSITIVE — AB
Barbiturates: NOT DETECTED
Benzodiazepines: NOT DETECTED
Cocaine: POSITIVE — AB
Opiates: NOT DETECTED
Tetrahydrocannabinol: POSITIVE — AB

## 2022-05-29 LAB — ETHANOL: Alcohol, Ethyl (B): 16 mg/dL — ABNORMAL HIGH (ref <10)

## 2022-05-29 LAB — TROPONIN I (HIGH SENSITIVITY)
Troponin I (High Sensitivity): 3 ng/L (ref <18)
Troponin I (High Sensitivity): 7 ng/L (ref <18)

## 2022-05-29 LAB — ACETAMINOPHEN LEVEL: Acetaminophen (Tylenol), Serum: <10 ug/mL — ABNORMAL LOW (ref 10–30)

## 2022-05-29 LAB — CBG MONITORING, ED: Glucose-Capillary: 171 mg/dL — ABNORMAL HIGH (ref 70–99)

## 2022-05-29 MED ORDER — NALOXONE HCL 0.4 MG/ML IJ SOLN
0.4000 mg | Freq: Once | INTRAMUSCULAR | Status: AC
  Administered 2022-05-29: 0.4 mg via INTRAVENOUS

## 2022-05-29 MED ORDER — ONDANSETRON HCL 4 MG/2ML IJ SOLN
4.0000 mg | Freq: Once | INTRAMUSCULAR | Status: AC
  Administered 2022-05-29: 4 mg via INTRAVENOUS
  Filled 2022-05-29: qty 2

## 2022-05-29 MED ORDER — POTASSIUM CHLORIDE CRYS ER 20 MEQ PO TBCR
40.0000 meq | EXTENDED_RELEASE_TABLET | Freq: Once | ORAL | Status: AC
  Administered 2022-05-29: 40 meq via ORAL
  Filled 2022-05-29: qty 2

## 2022-05-29 MED ORDER — SODIUM CHLORIDE 0.9 % IV BOLUS
1000.0000 mL | Freq: Once | INTRAVENOUS | Status: AC
  Administered 2022-05-29: 1000 mL via INTRAVENOUS

## 2022-05-29 MED ORDER — NALOXONE HCL 4 MG/0.1ML NA LIQD
1.0000 | Freq: Once | NASAL | Status: AC
  Administered 2022-05-29: 1 via NASAL

## 2022-05-29 MED ORDER — NALOXONE HCL 0.4 MG/ML IJ SOLN
INTRAMUSCULAR | Status: AC
  Filled 2022-05-29: qty 1

## 2022-05-29 NOTE — ED Triage Notes (Signed)
Pt was brought in by fam  or friends and pt barely  breathing and blue in color

## 2022-05-29 NOTE — Discharge Instructions (Signed)
Call your primary care doctor or specialist as discussed in the next 2-3 days.   Return immediately back to the ER if:  Your symptoms worsen within the next 12-24 hours. You develop new symptoms such as new fevers, persistent vomiting, new pain, shortness of breath, or new weakness or numbness, or if you have any other concerns.  

## 2022-05-29 NOTE — ED Provider Notes (Signed)
[facility] EMERGENCY DEPARTMENT Provider Note   CSN: [id number] Arrival date & time: 05/29/22  1909     History  Chief Complaint  Patient presents with   Drug Overdose    Elaine Morgan is a 51 y.o. female.  Patient brought in as chief complaint of drug overdose.  Was in the back of a another person's car when they noticed the patient was blue and not breathing and brought immediately to the ER.  Unable to obtain review of systems from patient as she is obtunded.       Home Medications Prior to Admission medications   Not on File      Allergies    Hydrocodone    Review of Systems   Review of Systems  Unable to perform ROS: Mental status change    Physical Exam Updated Vital Signs BP 139/79   Pulse 84   Temp (!) 97 F (36.1 C) (Oral)   Resp 11   Ht 5\' 7"  (1.702 m)   Wt 99.8 kg   LMP 01/13/2017   SpO2 93%   BMI 34.46 kg/m  Physical Exam Constitutional:      Appearance: She is ill-appearing and toxic-appearing.  HENT:     Head: Normocephalic.     Nose: Nose normal.  Eyes:     Pupils: Pupils are equal, round, and reactive to light.     Comments: Pupils pinpoint, sluggish but reactive 3-2 bilaterally.  Cardiovascular:     Rate and Rhythm: Normal rate.  Pulmonary:     Comments: Intermittent respiratory pauses.  Sonorous breathing.  Breath sounds equal bilaterally. Musculoskeletal:        General: Normal range of motion.  Neurological:     Comments: Sonorous breathing, obtunded     ED Results / Procedures / Treatments   Labs (all labs ordered are listed, but only abnormal results are displayed) Labs Reviewed  CBC WITH DIFFERENTIAL/PLATELET - Abnormal; Notable for the following components:      Result Value   WBC 14.1 (*)    Lymphs Abs 8.9 (*)    All other components within normal limits  COMPREHENSIVE METABOLIC PANEL - Abnormal; Notable for the following components:   Potassium 3.4 (*)    Glucose, Bld 180 (*)    Calcium 8.7 (*)    Total  Bilirubin 0.2 (*)    All other components within normal limits  ETHANOL - Abnormal; Notable for the following components:   Alcohol, Ethyl (B) 16 (*)    All other components within normal limits  RAPID URINE DRUG SCREEN, HOSP PERFORMED - Abnormal; Notable for the following components:   Cocaine POSITIVE (*)    Amphetamines POSITIVE (*)    Tetrahydrocannabinol POSITIVE (*)    All other components within normal limits  URINALYSIS, ROUTINE W REFLEX MICROSCOPIC - Abnormal; Notable for the following components:   Glucose, UA 50 (*)    All other components within normal limits  SALICYLATE LEVEL - Abnormal; Notable for the following components:   Salicylate Lvl <7.0 (*)    All other components within normal limits  ACETAMINOPHEN LEVEL - Abnormal; Notable for the following components:   Acetaminophen (Tylenol), Serum <10 (*)    All other components within normal limits  CBG MONITORING, ED - Abnormal; Notable for the following components:   Glucose-Capillary 171 (*)    All other components within normal limits  I-STAT CHEM 8, ED - Abnormal; Notable for the following components:   Glucose, Bld 175 (*)  Hemoglobin 15.6 (*)    All other components within normal limits  PATHOLOGIST SMEAR REVIEW  TROPONIN I (HIGH SENSITIVITY)  TROPONIN I (HIGH SENSITIVITY)    EKG EKG Interpretation  Date/Time:  Thursday May 29 2022 19:16:52 EDT Ventricular Rate:  94 PR Interval:  178 QRS Duration: 106 QT Interval:  417 QTC Calculation: 522 R Axis:   36 Text Interpretation: Sinus rhythm Borderline ST depression, lateral leads Prolonged QT interval Confirmed by Norman Clay ([id number]) on 05/29/2022 7:47:27 PM  Radiology DG Chest Portable 1 View  Result Date: 05/29/2022 CLINICAL DATA:  Shortness of breath following possible recent drug use, initial encounter EXAM: PORTABLE CHEST 1 VIEW COMPARISON:  02/04/2019 FINDINGS: The heart size and mediastinal contours are within normal limits. Both lungs are clear.  The visualized skeletal structures are unremarkable. IMPRESSION: No active disease. Electronically Signed   By: Alcide Clever M.D.   On: 05/29/2022 19:46    Procedures .Critical Care  Performed by: Cheryll Cockayne, MD Authorized by: Cheryll Cockayne, MD   Critical care provider statement:    Critical care time (minutes):  40   Critical care time was exclusive of:  Separately billable procedures and treating other patients and teaching time   Critical care was necessary to treat or prevent imminent or life-threatening deterioration of the following conditions:  Respiratory failure     Medications Ordered in ED Medications  naloxone (NARCAN) 0.4 MG/ML injection (  Not Given 05/29/22 1939)  naloxone [facility]) nasal spray 4 mg/0.1 mL (1 spray Nasal Provided for home use 05/29/22 1905)  naloxone [facility]) injection 0.4 mg (0.4 mg Intravenous Given 05/29/22 1914)  sodium chloride 0.9 % bolus 1,000 mL (0 mLs Intravenous Stopped 05/29/22 2041)  potassium chloride SA (KLOR-CON M) CR tablet 40 mEq (40 mEq Oral Given 05/29/22 2106)  ondansetron (ZOFRAN) injection 4 mg (4 mg Intravenous Given 05/29/22 2105)    ED Course/ Medical Decision Making/ A&P                           Medical Decision Making Amount and/or Complexity of Data Reviewed Labs: ordered. Radiology: ordered.  Risk Prescription drug management.   I was called immediately to the patient's bedside due to her respiratory failure.  She had similar drug overdose in 2020.  Patient appears blue/purplish in color with severe depressed respiratory rate.  Chin thrust provided and nonrebreather placed with O2 saturation 100%.  Narcan given intranasally 4 mg with good response.  IV line established and 0.4 mg of IV Narcan given with continued improvement in respiratory status.  Upon repeat evaluation 15 minutes, patient is breathing at a normal rate, waking up.  Labs otherwise unremarkable UDS showing polysubstance abuse.  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  Observed in the ER for several hours with no additional Narcan or adverse events.  Awake and alert answering all questions appropriately now.  Denies any suicidal homicidal ideations.  Discharged home in stable condition family present at bedside.       Final Clinical Impression(s) / ED Diagnoses Final diagnoses:  Accidental overdose, initial encounter  Acute respiratory failure with hypoxia [facility])    Rx / DC Orders ED Discharge Orders     None         Cheryll Cockayne, MD 05/29/22 2218

## 2022-05-30 LAB — PATHOLOGIST SMEAR REVIEW

## 2022-11-26 ENCOUNTER — Other Ambulatory Visit: Payer: Self-pay

## 2022-11-26 ENCOUNTER — Emergency Department (HOSPITAL_COMMUNITY): Payer: Self-pay

## 2022-11-26 ENCOUNTER — Encounter (HOSPITAL_COMMUNITY): Payer: Self-pay

## 2022-11-26 ENCOUNTER — Emergency Department (HOSPITAL_COMMUNITY)
Admission: EM | Admit: 2022-11-26 | Discharge: 2022-11-26 | Disposition: A | Discharge status: Home / Self Care | Payer: Self-pay | Attending: Emergency Medicine | Admitting: Emergency Medicine

## 2022-11-26 DIAGNOSIS — N132 Hydronephrosis with renal and ureteral calculous obstruction: Secondary | ICD-10-CM | POA: Insufficient documentation

## 2022-11-26 DIAGNOSIS — N200 Calculus of kidney: Secondary | ICD-10-CM

## 2022-11-26 DIAGNOSIS — N3001 Acute cystitis with hematuria: Secondary | ICD-10-CM | POA: Insufficient documentation

## 2022-11-26 LAB — CBC
HCT: 37.6 % (ref 36.0–46.0)
Hemoglobin: 12.6 g/dL (ref 12.0–15.0)
MCH: 30.6 pg (ref 26.0–34.0)
MCHC: 33.5 g/dL (ref 30.0–36.0)
MCV: 91.3 fL (ref 80.0–100.0)
Platelets: 112 10*3/uL — ABNORMAL LOW (ref 150–400)
RBC: 4.12 MIL/uL (ref 3.87–5.11)
RDW: 12.4 % (ref 11.5–15.5)
WBC: 12.5 10*3/uL — ABNORMAL HIGH (ref 4.0–10.5)
nRBC: 0 % (ref 0.0–0.2)

## 2022-11-26 LAB — BASIC METABOLIC PANEL
Anion gap: 11 (ref 5–15)
BUN: 21 mg/dL — ABNORMAL HIGH (ref 6–20)
CO2: 22 mmol/L (ref 22–32)
Calcium: 8.4 mg/dL — ABNORMAL LOW (ref 8.9–10.3)
Chloride: 100 mmol/L (ref 98–111)
Creatinine, Ser: 1.04 mg/dL — ABNORMAL HIGH (ref 0.44–1.00)
GFR, Estimated: >60 mL/min (ref >60)
Glucose, Bld: 139 mg/dL — ABNORMAL HIGH (ref 70–99)
Potassium: 3.2 mmol/L — ABNORMAL LOW (ref 3.5–5.1)
Sodium: 133 mmol/L — ABNORMAL LOW (ref 135–145)

## 2022-11-26 LAB — URINALYSIS, ROUTINE W REFLEX MICROSCOPIC
Bilirubin Urine: NEGATIVE
Glucose, UA: NEGATIVE mg/dL
Ketones, ur: 5 mg/dL — AB
Nitrite: NEGATIVE
Protein, ur: >=300 mg/dL — AB
RBC / HPF: >50 RBC/hpf — ABNORMAL HIGH (ref 0–5)
Specific Gravity, Urine: 1.026 (ref 1.005–1.030)
WBC, UA: >50 WBC/hpf — ABNORMAL HIGH (ref 0–5)
pH: 5 (ref 5.0–8.0)

## 2022-11-26 LAB — POC URINE PREG, ED: Preg Test, Ur: NEGATIVE

## 2022-11-26 MED ORDER — TAMSULOSIN HCL 0.4 MG PO CAPS
0.4000 mg | ORAL_CAPSULE | Freq: Once | ORAL | Status: AC
  Administered 2022-11-26: 0.4 mg via ORAL
  Filled 2022-11-26: qty 1

## 2022-11-26 MED ORDER — SODIUM CHLORIDE 0.9 % IV SOLN
1.0000 g | Freq: Once | INTRAVENOUS | Status: AC
  Administered 2022-11-26: 1 g via INTRAVENOUS
  Filled 2022-11-26: qty 10

## 2022-11-26 MED ORDER — ONDANSETRON HCL 4 MG PO TABS: 4.0000 mg | ORAL_TABLET | Freq: Four times a day (QID) | ORAL | 0 refills | Status: DC

## 2022-11-26 MED ORDER — SODIUM CHLORIDE 0.9 % IV BOLUS
1000.0000 mL | Freq: Once | INTRAVENOUS | Status: AC
Start: 2022-11-26 — End: 2022-11-26
  Administered 2022-11-26: 1000 mL via INTRAVENOUS

## 2022-11-26 MED ORDER — ONDANSETRON HCL 4 MG/2ML IJ SOLN
4.0000 mg | Freq: Once | INTRAMUSCULAR | Status: AC
  Administered 2022-11-26: 4 mg via INTRAVENOUS
  Filled 2022-11-26: qty 2

## 2022-11-26 MED ORDER — KETOROLAC TROMETHAMINE 30 MG/ML IJ SOLN
15.0000 mg | Freq: Once | INTRAMUSCULAR | Status: AC
  Administered 2022-11-26: 15 mg via INTRAVENOUS
  Filled 2022-11-26: qty 1

## 2022-11-26 MED ORDER — MORPHINE SULFATE (PF) 4 MG/ML IV SOLN
4.0000 mg | Freq: Once | INTRAVENOUS | Status: DC
  Filled 2022-11-26: qty 1

## 2022-11-26 MED ORDER — OXYCODONE-ACETAMINOPHEN 5-325 MG PO TABS: 1.0000 | ORAL_TABLET | Freq: Four times a day (QID) | ORAL | 0 refills | Status: DC | PRN

## 2022-11-26 MED ORDER — TAMSULOSIN HCL 0.4 MG PO CAPS: 0.4000 mg | ORAL_CAPSULE | Freq: Every day | ORAL | 0 refills | Status: DC

## 2022-11-26 MED ORDER — CEPHALEXIN 500 MG PO CAPS: 500.0000 mg | ORAL_CAPSULE | Freq: Four times a day (QID) | ORAL | 0 refills | Status: DC

## 2022-11-26 MED ORDER — HYDROMORPHONE HCL 1 MG/ML IJ SOLN
1.0000 mg | Freq: Once | INTRAMUSCULAR | Status: AC
  Administered 2022-11-26: 1 mg via INTRAVENOUS
  Filled 2022-11-26: qty 1

## 2022-11-26 NOTE — ED Triage Notes (Addendum)
Pt presents with intermittent R flank pain x5 days. Pt reports urine has been dark. Pt reports subjective fever. Pt states symptoms feel like previous kidney stones.

## 2022-11-26 NOTE — ED Provider Notes (Signed)
[facility] EMERGENCY DEPARTMENT Provider Note   CSN: [id number] Arrival date & time: 11/26/22  1411     History  Chief Complaint  Patient presents with   Flank Pain    Elaine Morgan is a 51 y.o. female with a history significant for kidney stones, last kidney stone complication occurring approximately 10 years ago, presenting with a 5-day history of right-sided flank pain which radiates into her right lower abdomen and has been associated with increased urinary frequency and darker than normal urine but without gross hematuria.  She reports nausea without emesis, bowel changes, has had subjective fever, has been taking Tylenol without significant symptom relief.  Her symptoms worsened today.  She reports she was not able to pass her last kidney stone, but cannot recall the details of the intervention required, she was seen in [place] at that time.  She denies chest pain, shortness of breath, weakness.  The history is provided by the patient.       Home Medications Prior to Admission medications   Medication Sig Start Date End Date Taking? Authorizing Provider  cephALEXin (KEFLEX) 500 MG capsule Take 1 capsule (500 mg total) by mouth 4 (four) times daily. 11/26/22  Yes Melaine Mcphee, Raynelle Fanning, PA-C  ondansetron (ZOFRAN) 4 MG tablet Take 1 tablet (4 mg total) by mouth every 6 (six) hours. 11/26/22  Yes Arif Amendola, Raynelle Fanning, PA-C  oxyCODONE-acetaminophen (PERCOCET/ROXICET) 5-325 MG tablet Take 1 tablet by mouth every 6 (six) hours as needed. 11/26/22  Yes Zhane Bluitt, Raynelle Fanning, PA-C  tamsulosin (FLOMAX) 0.4 MG CAPS capsule Take 1 capsule (0.4 mg total) by mouth daily after supper. 11/26/22  Yes Jasalyn Frysinger, Raynelle Fanning, PA-C      Allergies    Hydrocodone    Review of Systems   Review of Systems  Constitutional:  Positive for fever.  HENT:  Negative for congestion and sore throat.   Eyes: Negative.   Respiratory:  Negative for chest tightness and shortness of breath.   Cardiovascular:  Negative for chest pain.   Gastrointestinal:  Positive for nausea. Negative for abdominal pain and vomiting.  Genitourinary:  Positive for dysuria and flank pain.  Musculoskeletal:  Negative for arthralgias, joint swelling and neck pain.  Skin: Negative.  Negative for rash and wound.  Neurological:  Negative for dizziness, weakness, light-headedness, numbness and headaches.  Psychiatric/Behavioral: Negative.      Physical Exam Updated Vital Signs BP 118/80   Pulse 64   Temp 98.7 F (37.1 C) (Oral)   Resp 16   Ht 5\' 7"  (1.702 m)   Wt 81.6 kg   LMP 01/13/2017   SpO2 94%   BMI 28.19 kg/m  Physical Exam  ED Results / Procedures / Treatments   Labs (all labs ordered are listed, but only abnormal results are displayed) Labs Reviewed  URINALYSIS, ROUTINE W REFLEX MICROSCOPIC - Abnormal; Notable for the following components:      Result Value   Color, Urine AMBER (*)    APPearance CLOUDY (*)    Hgb urine dipstick SMALL (*)    Ketones, ur 5 (*)    Protein, ur >=300 (*)    Leukocytes,Ua MODERATE (*)    RBC / HPF >50 (*)    WBC, UA >50 (*)    Bacteria, UA MANY (*)    All other components within normal limits  BASIC METABOLIC PANEL - Abnormal; Notable for the following components:   Sodium 133 (*)    Potassium 3.2 (*)    Glucose, Bld 139 (*)  BUN 21 (*)    Creatinine, Ser 1.04 (*)    Calcium 8.4 (*)    All other components within normal limits  CBC - Abnormal; Notable for the following components:   WBC 12.5 (*)    Platelets 112 (*)    All other components within normal limits  URINE CULTURE  POC URINE PREG, ED    EKG None  Radiology CT Renal Stone Study  Result Date: 11/26/2022 CLINICAL DATA:  Abdominal pain, stone suspected. EXAM: CT ABDOMEN AND PELVIS WITHOUT CONTRAST TECHNIQUE: Multidetector CT imaging of the abdomen and pelvis was performed following the standard protocol without IV contrast. RADIATION DOSE REDUCTION: This exam was performed according to the departmental  dose-optimization program which includes automated exposure control, adjustment of the mA and/or kV according to patient size and/or use of iterative reconstruction technique. COMPARISON:  CT February 27, 2017. FINDINGS: Lower chest: Bibasilar atelectasis/scarring. Hepatobiliary: Unremarkable noncontrast enhanced appearance of the hepatic parenchyma. Gallbladder is dilated without wall thickening or pericholecystic fluid. Pancreas: No biliary ductal dilation. Spleen: No pancreatic ductal dilation or evidence of acute inflammation. Periampullary duodenal diverticulum. Adrenals/Urinary Tract: Mild bilateral adrenal thickening without discrete nodularity favor hyperplasia. Right perinephric/periureteric stranding with mild hydroureteronephrosis and a 5 mm stone either adjacent to or in the distal ureter near the UVJ reflecting a ureteral stone or phlebolith. Punctate nonobstructive left lower pole renal calculus. Urinary bladder is unremarkable for degree of distension. Stomach/Bowel: Stomach is unremarkable for degree of distension. No pathologic dilation of small or large bowel. No evidence of acute bowel inflammation. The appendix is not confidently identified however there is no pericecal inflammation. No evidence of acute bowel inflammation. Vascular/Lymphatic: Aortic atherosclerosis. No pathologically enlarged abdominal or pelvic lymph nodes. Reproductive: Uterus and bilateral adnexa are unremarkable. Other: No significant abdominopelvic free fluid. Musculoskeletal: No acute osseous abnormality. Multilevel degenerative changes spine. Degenerative change of the bilateral hips IMPRESSION: 1. Right perinephric/periureteric stranding with mild hydroureteronephrosis and a 5 mm stone either adjacent to or in the distal ureter near the UVJ reflecting a ureteral stone or phlebolith. Suggest correlation with laboratory values for infection. 2. Punctate nonobstructive left lower pole renal calculus. 3.  Aortic Atherosclerosis  (ICD10-I70.0). Electronically Signed   By: Maudry Mayhew M.D.   On: 11/26/2022 16:11    Procedures Procedures    Medications Ordered in ED Medications  tamsulosin (FLOMAX) capsule 0.4 mg (has no administration in time range)  sodium chloride 0.9 % bolus 1,000 mL (0 mLs Intravenous Stopped 11/26/22 1810)  ondansetron (ZOFRAN) injection 4 mg (4 mg Intravenous Given 11/26/22 1541)  ketorolac (TORADOL) 30 MG/ML injection 15 mg (15 mg Intravenous Given 11/26/22 1541)  cefTRIAXone (ROCEPHIN) 1 g in sodium chloride 0.9 % 100 mL IVPB (1 g Intravenous New Bag/Given 11/26/22 1811)  HYDROmorphone (DILAUDID) injection 1 mg (1 mg Intravenous Given 11/26/22 1809)  ondansetron (ZOFRAN) injection 4 mg (4 mg Intravenous Given 11/26/22 1809)    ED Course/ Medical Decision Making/ A&P                           Medical Decision Making Patient with a 5-day history of right flank pain dysuria and subjective fever.  CT imaging positive for right perinephric stranding and a 5 mm stone near the UVJ.  Urinalysis is also concerning for a probable UTI, she has moderate leukocytes, small amount of hemoglobin, many bacteria although there is also squamous epithelial cells as well.  A urine culture has been ordered.  Amount and/or Complexity of Data Reviewed Labs: ordered.    Details: Significant for WBC count of 12.5.  Her urine shows a small amount of hemoglobin, greater than 50 WBC and many bacteria, however there is also 11-20 squamous epithelials.  A urine culture has been sent.  She also has a mild hypokalemia with a potassium of 3.2, her creatinine is slightly bumped at 1.04. Radiology: ordered.    Details: CT renal showing right perinephric stranding with mild hydro and ureteral nephrosis.  There is a 5 mm stone at the right UVJ. Discussion of management or test interpretation with external provider(s): Discussed with Dr. Sherron Monday of urology who agrees with plan for antibiotics and close outpatient follow-up  as long as patient continues to feel well.  Strict return precautions for any fevers, vomiting, worse pain, preferably returning to [facility].  This was also discussed with the patient who agrees with this plan.  Risk Prescription drug management.           Final Clinical Impression(s) / ED Diagnoses Final diagnoses:  Kidney stone  Acute cystitis with hematuria    Rx / DC Orders ED Discharge Orders          Ordered    cephALEXin (KEFLEX) 500 MG capsule  4 times daily        11/26/22 1854    oxyCODONE-acetaminophen (PERCOCET/ROXICET) 5-325 MG tablet  Every 6 hours PRN        11/26/22 1854    tamsulosin (FLOMAX) 0.4 MG CAPS capsule  Daily after supper        11/26/22 1854    ondansetron (ZOFRAN) 4 MG tablet  Every 6 hours        11/26/22 1854              Victoriano Lain 11/28/22 2247    Bethann Berkshire, MD 12/04/22 [phone]

## 2022-11-26 NOTE — Discharge Instructions (Signed)
As discussed you do have a 5 mm stone that you are passing on the right-hand side.  It appears you may also have a mild urinary tract infection and therefore you are being placed on antibiotics.  Will be important that you take these antibiotics, you have also been prescribed pain and nausea medicine to take as needed, do not drive within 4 hours of taking oxycodone.  Additionally you are being prescribed a medicine called Flomax which may help the stone pass quicker as discussed.  You have received today's dose of this medicine here, take your next dose of the Flomax tomorrow evening.  Call Dr. Celso Amy for office follow-up, however get rechecked immediately as discussed if you develop any fevers, uncontrolled pain or uncontrolled vomiting.  If your symptoms worsen you may need a procedure to help retrieve the stone.  Either return here or ideally go to [facility] in [place] where the urologists are better able to take care of this condition.

## 2022-11-29 LAB — URINE CULTURE: Culture: >=100000 — AB

## 2023-03-13 ENCOUNTER — Other Ambulatory Visit: Payer: Self-pay

## 2023-03-13 ENCOUNTER — Emergency Department (HOSPITAL_COMMUNITY)
Admission: EM | Admit: 2023-03-13 | Discharge: 2023-03-13 | Disposition: A | Discharge status: Home / Self Care | Payer: Self-pay | Attending: Student | Admitting: Student

## 2023-03-13 ENCOUNTER — Encounter (HOSPITAL_COMMUNITY): Payer: Self-pay | Admitting: *Deleted

## 2023-03-13 DIAGNOSIS — F17210 Nicotine dependence, cigarettes, uncomplicated: Secondary | ICD-10-CM | POA: Insufficient documentation

## 2023-03-13 DIAGNOSIS — M5440 Lumbago with sciatica, unspecified side: Secondary | ICD-10-CM

## 2023-03-13 LAB — CBC WITH DIFFERENTIAL/PLATELET
Abs Immature Granulocytes: 0.02 10*3/uL (ref 0.00–0.07)
Basophils Absolute: 0.1 10*3/uL (ref 0.0–0.1)
Basophils Relative: 1 %
Eosinophils Absolute: 0.2 10*3/uL (ref 0.0–0.5)
Eosinophils Relative: 2 %
HCT: 40.9 % (ref 36.0–46.0)
Hemoglobin: 14 g/dL (ref 12.0–15.0)
Immature Granulocytes: 0 %
Lymphocytes Relative: 30 %
Lymphs Abs: 2.4 10*3/uL (ref 0.7–4.0)
MCH: 31.5 pg (ref 26.0–34.0)
MCHC: 34.2 g/dL (ref 30.0–36.0)
MCV: 91.9 fL (ref 80.0–100.0)
Monocytes Absolute: 0.5 10*3/uL (ref 0.1–1.0)
Monocytes Relative: 6 %
Neutro Abs: 4.8 10*3/uL (ref 1.7–7.7)
Neutrophils Relative %: 61 %
Platelets: 173 10*3/uL (ref 150–400)
RBC: 4.45 MIL/uL (ref 3.87–5.11)
RDW: 11.9 % (ref 11.5–15.5)
WBC: 7.9 10*3/uL (ref 4.0–10.5)
nRBC: 0 % (ref 0.0–0.2)

## 2023-03-13 LAB — COMPREHENSIVE METABOLIC PANEL
ALT: 10 U/L (ref 0–44)
AST: 14 U/L — ABNORMAL LOW (ref 15–41)
Albumin: 3.7 g/dL (ref 3.5–5.0)
Alkaline Phosphatase: 80 U/L (ref 38–126)
Anion gap: 9 (ref 5–15)
BUN: 16 mg/dL (ref 6–20)
CO2: 25 mmol/L (ref 22–32)
Calcium: 8.8 mg/dL — ABNORMAL LOW (ref 8.9–10.3)
Chloride: 101 mmol/L (ref 98–111)
Creatinine, Ser: 0.81 mg/dL (ref 0.44–1.00)
GFR, Estimated: >60 mL/min (ref >60)
Glucose, Bld: 89 mg/dL (ref 70–99)
Potassium: 3.3 mmol/L — ABNORMAL LOW (ref 3.5–5.1)
Sodium: 135 mmol/L (ref 135–145)
Total Bilirubin: 0.4 mg/dL (ref 0.3–1.2)
Total Protein: 7.5 g/dL (ref 6.5–8.1)

## 2023-03-13 LAB — LIPASE, BLOOD: Lipase: 26 U/L (ref 11–51)

## 2023-03-13 MED ORDER — HYDROCODONE-ACETAMINOPHEN 5-325 MG PO TABS
1.0000 | ORAL_TABLET | Freq: Once | ORAL | Status: AC
  Administered 2023-03-13: 1 via ORAL
  Filled 2023-03-13: qty 1

## 2023-03-13 MED ORDER — LIDOCAINE 5 % EX PTCH
1.0000 | MEDICATED_PATCH | CUTANEOUS | Status: DC
  Administered 2023-03-13: 1 via TRANSDERMAL
  Filled 2023-03-13: qty 1

## 2023-03-13 NOTE — ED Triage Notes (Signed)
Pt c/o back pain that radiates around to her stomach that started suddenly this morning. Pt also reports n/v this morning. Denies urinary symptoms.

## 2023-03-13 NOTE — ED Notes (Signed)
Patient asleep on stretcher, respirations even and unlabored.

## 2023-03-13 NOTE — ED Provider Notes (Signed)
[place] EMERGENCY DEPARTMENT AT [facility] Provider Note  CSN: [id number] Arrival date & time: 03/13/23 [id number]  Chief Complaint(s) Back Pain  HPI Elaine Morgan is a 52 y.o. female with PMH nephrolithiasis, cocaine use disorder who presents emergency department for evaluation of back pain.  She endorses single episode of nausea and vomiting secondary to the pain but denies dysuria, increased frequency, hematuria or other urinary symptoms.  States that this feels different than her previous kidney stones.  States that pain is limited to the back and will sometimes radiate to the abdomen.  Worse with twisting and stretching.  Denies chest pain, shortness of breath, abdominal pain, numbness, tingling, weakness or other systemic neurologic complaints.   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 Authorizing Provider  cephALEXin (KEFLEX) 500 MG capsule Take 1 capsule (500 mg total) by mouth 4 (four) times daily. 11/26/22   Burgess AmorIdol, Julie, PA-C  ondansetron (ZOFRAN) 4 MG tablet Take 1 tablet (4 mg total) by mouth every 6 (six) hours. 11/26/22   Burgess AmorIdol, Julie, PA-C  oxyCODONE-acetaminophen (PERCOCET/ROXICET) 5-325 MG tablet Take 1 tablet by mouth every 6 (six) hours as needed. 11/26/22   Burgess AmorIdol, Julie, PA-C  tamsulosin (FLOMAX) 0.4 MG CAPS capsule Take 1 capsule (0.4 mg total) by mouth daily after supper. 11/26/22   Burgess AmorIdol, Julie,  PA-C                                                                                                                                    Past Surgical History Past Surgical History:  Procedure Laterality Date   ANKLE FRACTURE SURGERY Left    CYSTOSCOPY WITH STENT PLACEMENT Left 11/28/2013   Procedure: CYSTOSCOPY WITH STENT PLACEMENT;  Surgeon: Ky BarbanMohammad I Javaid, MD;  Location: [facility];  Service: Urology;  Laterality: Left;   EXTRACORPOREAL SHOCK WAVE LITHOTRIPSY Left 01/11/2014   Procedure: EXTRACORPOREAL SHOCK WAVE LITHOTRIPSY (ESWL) LEFT RENAL CALCULUS;  Surgeon: Ky BarbanMohammad I Javaid, MD;  Location: [facility];  Service: Urology;  Laterality: Left;   FRACTURE SURGERY     ORIF MANDIBULAR FRACTURE Bilateral 10/07/2013   Procedure: BILATERAL ORIF MANDIBULAR FRACTURE INTERMAXILLARY FIXATION/EXTRACTION OF TOOTH;  Surgeon: Glenna FellowsBrinda Thimmappa, MD;  Location: [facility];  Service: Plastics;  Laterality: Bilateral;  ANESTHESIA: NASO TRACHEAL INTUBATION   TUBAL LIGATION  Family History History reviewed. No pertinent family history.  Social History Social History   Tobacco Use   Smoking status: Every Day    Packs/day: 1.00    Years: 25.00    Additional pack years: 0.00    Total pack years: 25.00    Types: Cigarettes   Smokeless tobacco: Never  Vaping Use   Vaping Use: Some days  Substance Use Topics   Alcohol use: Not Currently    Comment: Varied   Drug use: Yes    Types: "Crack" cocaine, Amphetamines    Comment: Last used cocaine yesterday   Allergies Hydrocodone  Review of Systems Review of Systems  Musculoskeletal:  Positive for back pain.    Physical Exam Vital Signs  I have reviewed the triage vital signs BP (!) 213/150 (BP Location: Left Arm)   Pulse (!) 55   Temp (!) 97.3 F (36.3 C) (Oral)   Resp (!) 22   Ht 5\' 7"  (1.702 m)   Wt 68 kg   LMP 01/13/2017   SpO2 100%   BMI 23.48 kg/m   Physical Exam Vitals and nursing note reviewed.  Constitutional:      General: She is  not in acute distress.    Appearance: She is well-developed.  HENT:     Head: Normocephalic and atraumatic.  Eyes:     Conjunctiva/sclera: Conjunctivae normal.  Cardiovascular:     Rate and Rhythm: Normal rate and regular rhythm.     Heart sounds: No murmur heard. Pulmonary:     Effort: Pulmonary effort is normal. No respiratory distress.     Breath sounds: Normal breath sounds.  Abdominal:     Palpations: Abdomen is soft.     Tenderness: There is no abdominal tenderness.  Musculoskeletal:        General: Tenderness present. No swelling.     Cervical back: Neck supple.  Skin:    General: Skin is warm and dry.     Capillary Refill: Capillary refill takes less than 2 seconds.  Neurological:     Mental Status: She is alert.  Psychiatric:        Mood and Affect: Mood normal.     ED Results and Treatments Labs (all labs ordered are listed, but only abnormal results are displayed) Labs Reviewed  COMPREHENSIVE METABOLIC PANEL - Abnormal; Notable for the following components:      Result Value   Potassium 3.3 (*)    Calcium 8.8 (*)    AST 14 (*)    All other components within normal limits  CBC WITH DIFFERENTIAL/PLATELET  LIPASE, BLOOD                                                                                                                          Radiology No results found.  Pertinent labs & imaging results that were available during my care of the patient were reviewed by me and considered in my medical decision making (see MDM for details).  Medications Ordered in ED Medications  HYDROcodone-acetaminophen (NORCO/VICODIN) 5-325 MG per tablet 1 tablet (1 tablet Oral Given 03/13/23 1040)                                                                                                                                     Procedures Procedures  (including critical care time)  Medical Decision Making / ED Course   This patient presents to the ED for concern of back  pain, this involves an extensive number of treatment options, and is a complaint that carries with it a high risk of complications and morbidity.  The differential diagnosis includes muscular strain/spasm, lumbago, disc herniation, spinal fracture, cauda equina, epidural abscess or hematoma, psoas abscess, pyelonephritis,  MDM: Patient seen emergency room for evaluation of back pain.  Physical exam with bilateral lumbar paraspinal tenderness to palpation.  Neurologic exam unremarkable with no focal motor or sensory deficits.  No suprapubic tenderness to palpation or abdominal tenderness to palpation.  Laboratory evaluation is reassuring outside of some mild hypokalemia to 3.3.  No significant leukocytosis and I have overall lower suspicion for pyelonephritis given no fevers, urinary symptoms or leukocytosis.  Patient received single dose Norco and a Lidoderm patch and on reevaluation symptoms significant improved.  Patient able to ambulate without difficulty.  Low suspicion for aortic dissection or AAA given no associated symptoms with the back pain including chest pain or neurologic deficits, no abdominal pain and reassuring laboratory evaluation.  Of note, initial vital signs with significant hypertension to 213/150.  After pain control, vital signs improved and patient not significantly hypertensive.  Suspect initial hypertension secondary to back pain.  On reevaluation, patient remains symptomatically improved and was discharged with outpatient follow-up.  She was given strict return precautions which include worsening abdominal pain, vomiting, fevers, numbness, tingling, weakness or any other concerning symptoms.   Additional history obtained:  -External records from outside source obtained and reviewed including: Chart review including previous notes, labs, imaging, consultation notes   Lab Tests: -I ordered, reviewed, and interpreted labs.   The pertinent results include:   Labs Reviewed   COMPREHENSIVE METABOLIC PANEL - Abnormal; Notable for the following components:      Result Value   Potassium 3.3 (*)    Calcium 8.8 (*)    AST 14 (*)    All other components within normal limits  CBC WITH DIFFERENTIAL/PLATELET  LIPASE, BLOOD      Medicines ordered and prescription drug management: Meds ordered this encounter  Medications   DISCONTD: lidocaine (LIDODERM) 5 % 1 patch   HYDROcodone-acetaminophen (NORCO/VICODIN) 5-325 MG per tablet 1 tablet    -I have reviewed the patients home medicines and have made adjustments as needed  Critical interventions none   Cardiac Monitoring: The patient was maintained on a cardiac monitor.  I personally viewed and interpreted the cardiac monitored which showed an underlying rhythm of: NSR  Social Determinants of  Health:  Factors impacting patients care include: none   Reevaluation: After the interventions noted above, I reevaluated the patient and found that they have :improved  Co morbidities that complicate the patient evaluation  Past Medical History:  Diagnosis Date   Chronic right shoulder pain    Complication of anesthesia    ' I have a hard time breathing after surgery"   Kidney stone    Kidney stone       Dispostion: I considered admission for this patient, but at this time, with symptoms improved, patient does not meet inpatient criteria for admission she is safe for discharge with outpatient follow-up and strict return precautions which she voiced understanding     Final Clinical Impression(s) / ED Diagnoses Final diagnoses:  Acute bilateral low back pain with sciatica, sciatica laterality unspecified     @PCDICTATION @    Glendora Score, MD 03/13/23 2134

## 2023-07-16 ENCOUNTER — Emergency Department (HOSPITAL_COMMUNITY)
Admission: EM | Admit: 2023-07-16 | Discharge: 2023-07-16 | Disposition: A | Discharge status: Home / Self Care | Payer: Self-pay

## 2023-07-16 NOTE — ED Notes (Signed)
Patient to room to triage and while nurse left to get spo2 sticker for triage patient walked out of room and said she was leaving. Patient refused to stay and be seen.

## 2023-11-22 ENCOUNTER — Emergency Department (HOSPITAL_COMMUNITY)
Admission: EM | Admit: 2023-11-22 | Discharge: 2023-11-23 | Disposition: A | Discharge status: Home / Self Care | Payer: Self-pay | Attending: Emergency Medicine | Admitting: Emergency Medicine

## 2023-11-22 DIAGNOSIS — R404 Transient alteration of awareness: Secondary | ICD-10-CM

## 2023-11-22 DIAGNOSIS — F1490 Cocaine use, unspecified, uncomplicated: Secondary | ICD-10-CM | POA: Insufficient documentation

## 2023-11-22 DIAGNOSIS — M545 Low back pain, unspecified: Secondary | ICD-10-CM | POA: Insufficient documentation

## 2023-11-22 DIAGNOSIS — R55 Syncope and collapse: Secondary | ICD-10-CM | POA: Insufficient documentation

## 2023-11-22 DIAGNOSIS — Z79899 Other long term (current) drug therapy: Secondary | ICD-10-CM | POA: Insufficient documentation

## 2023-11-22 DIAGNOSIS — W19XXXA Unspecified fall, initial encounter: Secondary | ICD-10-CM | POA: Insufficient documentation

## 2023-11-22 DIAGNOSIS — F1410 Cocaine abuse, uncomplicated: Secondary | ICD-10-CM

## 2023-11-22 DIAGNOSIS — R531 Weakness: Secondary | ICD-10-CM | POA: Insufficient documentation

## 2023-11-22 DIAGNOSIS — M21331 Wrist drop, right wrist: Secondary | ICD-10-CM

## 2023-11-22 DIAGNOSIS — M25552 Pain in left hip: Secondary | ICD-10-CM | POA: Insufficient documentation

## 2023-11-22 NOTE — ED Triage Notes (Addendum)
Pt states that her friend found her on the floor at her house around 4am.  She does not know how she ended up in the floor but states that she has limited movement in her right hand and it feels tingly. Pt is able to slightly move fingers but not make a fist.  Pt states that the last thing she recalls was putting something in the microwave around 1am and her friend found her on the kitchen flood.  Pt is alert and oriented.  Pt has gate abnormality but states that this is preexisting and has been progressing since fall a year ago (has not had medical attention for this).  Pt is alert and oriented. Other than right hand no focal weakness. Pt is tearful when describing the severity of her lower back pain which has been increasing for one year since fall on her tailbone.

## 2023-11-23 ENCOUNTER — Emergency Department (HOSPITAL_COMMUNITY): Payer: Self-pay

## 2023-11-23 ENCOUNTER — Encounter (HOSPITAL_COMMUNITY): Payer: Self-pay | Admitting: *Deleted

## 2023-11-23 ENCOUNTER — Other Ambulatory Visit: Payer: Self-pay

## 2023-11-23 LAB — COMPREHENSIVE METABOLIC PANEL
ALT: 14 U/L (ref 0–44)
AST: 19 U/L (ref 15–41)
Albumin: 3.4 g/dL — ABNORMAL LOW (ref 3.5–5.0)
Alkaline Phosphatase: 68 U/L (ref 38–126)
Anion gap: 11 (ref 5–15)
BUN: 17 mg/dL (ref 6–20)
CO2: 26 mmol/L (ref 22–32)
Calcium: 8.8 mg/dL — ABNORMAL LOW (ref 8.9–10.3)
Chloride: 100 mmol/L (ref 98–111)
Creatinine, Ser: 0.62 mg/dL (ref 0.44–1.00)
GFR, Estimated: >60 mL/min (ref >60)
Glucose, Bld: 82 mg/dL (ref 70–99)
Potassium: 3.7 mmol/L (ref 3.5–5.1)
Sodium: 137 mmol/L (ref 135–145)
Total Bilirubin: 0.7 mg/dL (ref <1.2)
Total Protein: 6.9 g/dL (ref 6.5–8.1)

## 2023-11-23 LAB — CBC WITH DIFFERENTIAL/PLATELET
Abs Immature Granulocytes: 0.02 10*3/uL (ref 0.00–0.07)
Basophils Absolute: 0 10*3/uL (ref 0.0–0.1)
Basophils Relative: 0 %
Eosinophils Absolute: 0.2 10*3/uL (ref 0.0–0.5)
Eosinophils Relative: 2 %
HCT: 34.3 % — ABNORMAL LOW (ref 36.0–46.0)
Hemoglobin: 11.7 g/dL — ABNORMAL LOW (ref 12.0–15.0)
Immature Granulocytes: 0 %
Lymphocytes Relative: 19 %
Lymphs Abs: 1.5 10*3/uL (ref 0.7–4.0)
MCH: 32.5 pg (ref 26.0–34.0)
MCHC: 34.1 g/dL (ref 30.0–36.0)
MCV: 95.3 fL (ref 80.0–100.0)
Monocytes Absolute: 0.3 10*3/uL (ref 0.1–1.0)
Monocytes Relative: 4 %
Neutro Abs: 6 10*3/uL (ref 1.7–7.7)
Neutrophils Relative %: 75 %
Platelets: 217 10*3/uL (ref 150–400)
RBC: 3.6 MIL/uL — ABNORMAL LOW (ref 3.87–5.11)
RDW: 11.9 % (ref 11.5–15.5)
WBC: 8.1 10*3/uL (ref 4.0–10.5)
nRBC: 0 % (ref 0.0–0.2)

## 2023-11-23 LAB — URINALYSIS, ROUTINE W REFLEX MICROSCOPIC
Bacteria, UA: NONE SEEN
Bilirubin Urine: NEGATIVE
Glucose, UA: NEGATIVE mg/dL
Hgb urine dipstick: NEGATIVE
Ketones, ur: 20 mg/dL — AB
Leukocytes,Ua: NEGATIVE
Nitrite: NEGATIVE
Protein, ur: 30 mg/dL — AB
Specific Gravity, Urine: 1.018 (ref 1.005–1.030)
pH: 6 (ref 5.0–8.0)

## 2023-11-23 LAB — RAPID URINE DRUG SCREEN, HOSP PERFORMED
Amphetamines: NOT DETECTED
Barbiturates: NOT DETECTED
Benzodiazepines: NOT DETECTED
Cocaine: POSITIVE — AB
Opiates: NOT DETECTED
Tetrahydrocannabinol: NOT DETECTED

## 2023-11-23 LAB — CK: Total CK: 101 U/L (ref 38–234)

## 2023-11-23 LAB — ETHANOL: Alcohol, Ethyl (B): <10 mg/dL (ref <10)

## 2023-11-23 MED ORDER — IOHEXOL 350 MG/ML SOLN: 75.0000 mL | Freq: Once | INTRAVENOUS | Status: DC | PRN

## 2023-11-23 NOTE — ED Notes (Addendum)
Pt pulling off her monitor stating "ya'll don't care, ya'll just think I want pain pills" after Dr dc'd her. Pt stated "If I wante dpain meds, I'd just go buy them". Pt refusing dc vitals check. IV removed by nurse.

## 2023-11-23 NOTE — ED Provider Notes (Signed)
[place] [facility]MENT AT [facility]  Provider Note  CSN: [id number] Arrival date & time: 11/22/23 2343  History Chief Complaint  Patient presents with   Hand Problem   Loss of Consciousness    Elaine Morgan is a 52 y.o. right-handed female presents from home for evaluation of R hand weakness. She reports she was found unconscious on the floor of her house about 20 hours prior to arrival (around 4am) by a friend. The last thing she remembers was putting something in the microwave, she is unsure how long she was on the floor. Since then she has been unable to use her R hand. She does not have any headache, CP, SOB, fever, N/V/D. She has a history of fentanyl/heroin overdose in Jun 2023, but denies any recent opioid use. She admits to crack cocaine use 2 days ago. She has had chronic low back and R hip pain since a fall about a year ago. Has been seen in the ED in Apr 2024 but no imaging was done then. She has trouble walking at times due to pain. RN reports antalgic gait from triage.    Home Medications Prior to Admission medications   Medication Sig Start Date End Date Taking? Authorizing Provider  cephALEXin (KEFLEX) 500 MG capsule Take 1 capsule (500 mg total) by mouth 4 (four) times daily. 11/26/22   Burgess Amor, PA-C  ondansetron (ZOFRAN) 4 MG tablet Take 1 tablet (4 mg total) by mouth every 6 (six) hours. 11/26/22   Burgess Amor, PA-C  oxyCODONE-acetaminophen (PERCOCET/ROXICET) 5-325 MG tablet Take 1 tablet by mouth every 6 (six) hours as needed. 11/26/22   Burgess Amor, PA-C  tamsulosin (FLOMAX) 0.4 MG CAPS capsule Take 1 capsule (0.4 mg total) by mouth daily after supper. 11/26/22   Burgess Amor, PA-C     Allergies    Hydrocodone   Review of Systems   Review of Systems Please see HPI for pertinent positives and negatives  Physical Exam BP (!) 160/86   Pulse 65   Temp 98.3 F (36.8 C)   Resp 12   Wt 67.6 kg   LMP 01/13/2017   SpO2 96%   BMI 23.34  kg/m   Physical Exam Vitals and nursing note reviewed.  Constitutional:      Appearance: Normal appearance.  HENT:     Head: Normocephalic and atraumatic.     Nose: Nose normal.     Mouth/Throat:     Mouth: Mucous membranes are moist.  Eyes:     Extraocular Movements: Extraocular movements intact.     Conjunctiva/sclera: Conjunctivae normal.  Cardiovascular:     Rate and Rhythm: Normal rate.  Pulmonary:     Effort: Pulmonary effort is normal.     Breath sounds: Normal breath sounds.  Abdominal:     General: Abdomen is flat.     Palpations: Abdomen is soft.     Tenderness: There is no abdominal tenderness.  Musculoskeletal:        General: No swelling or tenderness. Normal range of motion.     Cervical back: Neck supple.  Skin:    General: Skin is warm and dry.  Neurological:     Mental Status: She is alert and oriented to person, place, and time.     Cranial Nerves: No cranial nerve deficit.     Sensory: No sensory deficit.     Motor: Weakness (isolated R wrist drop, strength normal otherwise) present.     Coordination: Coordination normal.  Gait: Gait abnormal (antalgic).     Deep Tendon Reflexes: Reflexes normal.  Psychiatric:        Mood and Affect: Mood normal.     ED Results / Procedures / Treatments   EKG EKG Interpretation Date/Time:  Monday November 23 2023 00:00:47 EST Ventricular Rate:  79 PR Interval:  137 QRS Duration:  92 QT Interval:  431 QTC Calculation: 495 R Axis:   32  Text Interpretation: Sinus rhythm Borderline prolonged QT interval No significant change since last tracing Confirmed by Susy Frizzle [phone]) on 11/23/2023 12:03:36 AM  Procedures Procedures  Medications Ordered in the ED Medications  iohexol (OMNIPAQUE) 350 MG/ML injection 75 mL (has no administration in time range)    Initial Impression and Plan  Patient here with an episode of unconsciousness ~20hrs PTA and now with an isolated R wrist drop likely from the  position she was in on the floor. No other focal deficits to suggest a CVA.  Will check labs for any acute condition. EKG with mildly prolonged QT but improved from previous. For the wrist drop, she will need a cock-up splint and outpatient Hand follow up for long term management.   ED Course   Clinical Course as of 11/23/23 0339  Mon Nov 23, 2023  0046 CBC is unremarkable.  [CS]  0057 EtOH is neg.  [CS]  0058 CMP is normal.  [CS]  0131 CK is normal.  [CS]  0204 UDS positive for cocaine, consistent with reported history.  [CS]  0247 UA is clear. Patient has been sleeping soundly in the room. No concerning findings on ED workup tonight and no indication for admission. Recommend she wear her wrist brace and follow up with Hand for further management, PT,  etc. Advised to stop using illicit substances. PCP follow up, RTED for any other concerns.   [CS]  0259 Patient now states she 'has an aneurysm' and is worried this is contributing to her wrist drop. She is requesting a CT scan. Last imaging was done in 2020 and it seems she has been lost to follow up. Will check CTA to ensure no signs of complications.  [CS]  [phone] Patient is now refusing CTA, states she is not worried about her aneurysm. She has accused me of treating her inappropriately because of her prior opioid use; I attempted to explain the relationship between sedative use and her wrist drop. She says 'I didn't sleep on my hand'. At this point she is asking for her IV to be removed and for her discharge paperwork. She was advised to follow up with Hand as above.  [CS]    Clinical Course User Index [CS] Pollyann Savoy, MD     MDM Rules/Calculators/A&P Medical Decision Making Problems Addressed: Cocaine use disorder [facility]): chronic illness or injury Right wrist drop: acute illness or injury Unresponsive episode: acute illness or injury  Amount and/or Complexity of Data Reviewed Labs: ordered. Decision-making details documented in  ED Course. Radiology: ordered. ECG/medicine tests: ordered and independent interpretation performed. Decision-making details documented in ED Course.  Risk Prescription drug management.     Final Clinical Impression(s) / ED Diagnoses Final diagnoses:  Right wrist drop  Unresponsive episode  Cocaine use disorder [facility])    Rx / DC Orders ED Discharge Orders     None        Pollyann Savoy, MD 11/23/23 [phone]

## 2023-11-23 NOTE — ED Notes (Signed)
Patient transported to CT 

## 2024-02-08 ENCOUNTER — Other Ambulatory Visit: Payer: Self-pay

## 2024-02-08 ENCOUNTER — Inpatient Hospital Stay (HOSPITAL_COMMUNITY): Payer: Self-pay

## 2024-02-08 ENCOUNTER — Emergency Department (HOSPITAL_COMMUNITY): Payer: Self-pay

## 2024-02-08 ENCOUNTER — Inpatient Hospital Stay (HOSPITAL_COMMUNITY)
Admission: EM | Admit: 2024-02-08 | Discharge: 2024-02-11 | DRG: 193 | Disposition: A | Discharge status: Home / Self Care | Payer: MEDICAID | Attending: Family Medicine | Admitting: Family Medicine

## 2024-02-08 ENCOUNTER — Encounter (HOSPITAL_COMMUNITY): Payer: Self-pay | Admitting: *Deleted

## 2024-02-08 DIAGNOSIS — G9341 Metabolic encephalopathy: Secondary | ICD-10-CM | POA: Diagnosis present

## 2024-02-08 DIAGNOSIS — F1414 Cocaine abuse with cocaine-induced mood disorder: Secondary | ICD-10-CM | POA: Diagnosis present

## 2024-02-08 DIAGNOSIS — E876 Hypokalemia: Secondary | ICD-10-CM | POA: Diagnosis present

## 2024-02-08 DIAGNOSIS — Z885 Allergy status to narcotic agent status: Secondary | ICD-10-CM

## 2024-02-08 DIAGNOSIS — Z716 Tobacco abuse counseling: Secondary | ICD-10-CM

## 2024-02-08 DIAGNOSIS — J9601 Acute respiratory failure with hypoxia: Secondary | ICD-10-CM | POA: Diagnosis present

## 2024-02-08 DIAGNOSIS — F1210 Cannabis abuse, uncomplicated: Secondary | ICD-10-CM | POA: Diagnosis present

## 2024-02-08 DIAGNOSIS — J111 Influenza due to unidentified influenza virus with other respiratory manifestations: Secondary | ICD-10-CM

## 2024-02-08 DIAGNOSIS — G928 Other toxic encephalopathy: Secondary | ICD-10-CM | POA: Diagnosis present

## 2024-02-08 DIAGNOSIS — G9340 Encephalopathy, unspecified: Secondary | ICD-10-CM

## 2024-02-08 DIAGNOSIS — J1000 Influenza due to other identified influenza virus with unspecified type of pneumonia: Principal | ICD-10-CM | POA: Diagnosis present

## 2024-02-08 DIAGNOSIS — F1110 Opioid abuse, uncomplicated: Secondary | ICD-10-CM | POA: Diagnosis present

## 2024-02-08 DIAGNOSIS — J09X1 Influenza due to identified novel influenza A virus with pneumonia: Secondary | ICD-10-CM | POA: Diagnosis present

## 2024-02-08 DIAGNOSIS — M25551 Pain in right hip: Secondary | ICD-10-CM

## 2024-02-08 DIAGNOSIS — F1494 Cocaine use, unspecified with cocaine-induced mood disorder: Secondary | ICD-10-CM | POA: Diagnosis present

## 2024-02-08 DIAGNOSIS — M160 Bilateral primary osteoarthritis of hip: Secondary | ICD-10-CM | POA: Diagnosis present

## 2024-02-08 DIAGNOSIS — J189 Pneumonia, unspecified organism: Principal | ICD-10-CM

## 2024-02-08 DIAGNOSIS — F17210 Nicotine dependence, cigarettes, uncomplicated: Secondary | ICD-10-CM | POA: Diagnosis present

## 2024-02-08 DIAGNOSIS — Z87442 Personal history of urinary calculi: Secondary | ICD-10-CM

## 2024-02-08 DIAGNOSIS — Z79899 Other long term (current) drug therapy: Secondary | ICD-10-CM

## 2024-02-08 LAB — CBC WITH DIFFERENTIAL/PLATELET
Abs Immature Granulocytes: 0.02 10*3/uL (ref 0.00–0.07)
Basophils Absolute: 0 10*3/uL (ref 0.0–0.1)
Basophils Relative: 0 %
Eosinophils Absolute: 0 10*3/uL (ref 0.0–0.5)
Eosinophils Relative: 0 %
HCT: 35.1 % — ABNORMAL LOW (ref 36.0–46.0)
Hemoglobin: 12.2 g/dL (ref 12.0–15.0)
Immature Granulocytes: 0 %
Lymphocytes Relative: 11 %
Lymphs Abs: 0.7 10*3/uL (ref 0.7–4.0)
MCH: 31.6 pg (ref 26.0–34.0)
MCHC: 34.8 g/dL (ref 30.0–36.0)
MCV: 90.9 fL (ref 80.0–100.0)
Monocytes Absolute: 0.8 10*3/uL (ref 0.1–1.0)
Monocytes Relative: 12 %
Neutro Abs: 5.3 10*3/uL (ref 1.7–7.7)
Neutrophils Relative %: 77 %
Platelets: 111 10*3/uL — ABNORMAL LOW (ref 150–400)
RBC: 3.86 MIL/uL — ABNORMAL LOW (ref 3.87–5.11)
RDW: 12.1 % (ref 11.5–15.5)
WBC: 6.9 10*3/uL (ref 4.0–10.5)
nRBC: 0 % (ref 0.0–0.2)

## 2024-02-08 LAB — COMPREHENSIVE METABOLIC PANEL
ALT: 31 U/L (ref 0–44)
AST: 41 U/L (ref 15–41)
Albumin: 3.4 g/dL — ABNORMAL LOW (ref 3.5–5.0)
Alkaline Phosphatase: 61 U/L (ref 38–126)
Anion gap: 13 (ref 5–15)
BUN: 16 mg/dL (ref 6–20)
CO2: 22 mmol/L (ref 22–32)
Calcium: 8.6 mg/dL — ABNORMAL LOW (ref 8.9–10.3)
Chloride: 105 mmol/L (ref 98–111)
Creatinine, Ser: 0.67 mg/dL (ref 0.44–1.00)
GFR, Estimated: >60 mL/min (ref >60)
Glucose, Bld: 83 mg/dL (ref 70–99)
Potassium: 3.2 mmol/L — ABNORMAL LOW (ref 3.5–5.1)
Sodium: 140 mmol/L (ref 135–145)
Total Bilirubin: 0.4 mg/dL (ref 0.0–1.2)
Total Protein: 7 g/dL (ref 6.5–8.1)

## 2024-02-08 LAB — ETHANOL: Alcohol, Ethyl (B): <10 mg/dL (ref <10)

## 2024-02-08 LAB — RESP PANEL BY RT-PCR (RSV, FLU A&B, COVID)  RVPGX2
Influenza A by PCR: POSITIVE — AB
Influenza B by PCR: NEGATIVE
Resp Syncytial Virus by PCR: NEGATIVE
SARS Coronavirus 2 by RT PCR: NEGATIVE

## 2024-02-08 LAB — PROCALCITONIN: Procalcitonin: <0.1 ng/mL

## 2024-02-08 LAB — C-REACTIVE PROTEIN: CRP: 4.5 mg/dL — ABNORMAL HIGH (ref <1.0)

## 2024-02-08 LAB — LACTIC ACID, PLASMA: Lactic Acid, Venous: 1.1 mmol/L (ref 0.5–1.9)

## 2024-02-08 LAB — MAGNESIUM: Magnesium: 1.4 mg/dL — ABNORMAL LOW (ref 1.7–2.4)

## 2024-02-08 LAB — GLUCOSE, CAPILLARY: Glucose-Capillary: 81 mg/dL (ref 70–99)

## 2024-02-08 LAB — SEDIMENTATION RATE: Sed Rate: 23 mm/h — ABNORMAL HIGH (ref 0–22)

## 2024-02-08 MED ORDER — ENOXAPARIN SODIUM 40 MG/0.4ML IJ SOSY
40.0000 mg | PREFILLED_SYRINGE | INTRAMUSCULAR | Status: DC
  Administered 2024-02-08 – 2024-02-10 (×3): 40 mg via SUBCUTANEOUS
  Filled 2024-02-08 (×3): qty 0.4

## 2024-02-08 MED ORDER — LORAZEPAM 2 MG/ML IJ SOLN
1.0000 mg | Freq: Once | INTRAMUSCULAR | Status: AC
  Administered 2024-02-08: 1 mg via INTRAVENOUS
  Filled 2024-02-08: qty 1

## 2024-02-08 MED ORDER — KCL IN DEXTROSE-NACL 40-5-0.9 MEQ/L-%-% IV SOLN: INTRAVENOUS | Status: AC

## 2024-02-08 MED ORDER — ACETAMINOPHEN 325 MG PO TABS
650.0000 mg | ORAL_TABLET | Freq: Once | ORAL | Status: AC
  Administered 2024-02-08: 650 mg via ORAL
  Filled 2024-02-08: qty 2

## 2024-02-08 MED ORDER — OSELTAMIVIR PHOSPHATE 75 MG PO CAPS
75.0000 mg | ORAL_CAPSULE | Freq: Once | ORAL | Status: AC
  Administered 2024-02-08: 75 mg via ORAL
  Filled 2024-02-08: qty 1

## 2024-02-08 MED ORDER — MORPHINE SULFATE (PF) 4 MG/ML IV SOLN
4.0000 mg | Freq: Once | INTRAVENOUS | Status: AC
  Administered 2024-02-08: 4 mg via INTRAVENOUS
  Filled 2024-02-08: qty 1

## 2024-02-08 MED ORDER — POLYETHYLENE GLYCOL 3350 17 G PO PACK: 17.0000 g | PACK | Freq: Every day | ORAL | Status: DC | PRN

## 2024-02-08 MED ORDER — ONDANSETRON HCL 4 MG/2ML IJ SOLN: 4.0000 mg | Freq: Four times a day (QID) | INTRAMUSCULAR | Status: DC | PRN

## 2024-02-08 MED ORDER — SODIUM CHLORIDE 0.9 % IV SOLN
500.0000 mg | INTRAVENOUS | Status: DC
  Administered 2024-02-09 – 2024-02-10 (×2): 500 mg via INTRAVENOUS
  Filled 2024-02-08 (×2): qty 5

## 2024-02-08 MED ORDER — MORPHINE SULFATE (PF) 2 MG/ML IV SOLN
2.0000 mg | INTRAVENOUS | Status: DC | PRN
  Administered 2024-02-09 – 2024-02-10 (×4): 2 mg via INTRAVENOUS
  Filled 2024-02-08 (×5): qty 1

## 2024-02-08 MED ORDER — ONDANSETRON HCL 4 MG PO TABS: 4.0000 mg | ORAL_TABLET | Freq: Four times a day (QID) | ORAL | Status: DC | PRN

## 2024-02-08 MED ORDER — ACETAMINOPHEN 325 MG PO TABS
650.0000 mg | ORAL_TABLET | Freq: Four times a day (QID) | ORAL | Status: DC | PRN
  Administered 2024-02-10 – 2024-02-11 (×2): 650 mg via ORAL
  Filled 2024-02-08 (×2): qty 2

## 2024-02-08 MED ORDER — SODIUM CHLORIDE 0.9 % IV SOLN
2.0000 g | INTRAVENOUS | Status: DC
  Administered 2024-02-09 – 2024-02-10 (×2): 2 g via INTRAVENOUS
  Filled 2024-02-08 (×2): qty 20

## 2024-02-08 MED ORDER — SODIUM CHLORIDE 0.9 % IV BOLUS
500.0000 mL | Freq: Once | INTRAVENOUS | Status: AC
  Administered 2024-02-08: 500 mL via INTRAVENOUS

## 2024-02-08 MED ORDER — SODIUM CHLORIDE 0.9 % IV SOLN
500.0000 mg | Freq: Once | INTRAVENOUS | Status: AC
  Administered 2024-02-08: 500 mg via INTRAVENOUS
  Filled 2024-02-08: qty 5

## 2024-02-08 MED ORDER — LORAZEPAM 2 MG/ML IJ SOLN: 0.5000 mg | INTRAMUSCULAR | Status: DC | PRN

## 2024-02-08 MED ORDER — SODIUM CHLORIDE 0.9 % IV SOLN
1.0000 g | Freq: Once | INTRAVENOUS | Status: AC
  Administered 2024-02-08: 1 g via INTRAVENOUS
  Filled 2024-02-08: qty 10

## 2024-02-08 MED ORDER — OSELTAMIVIR PHOSPHATE 75 MG PO CAPS
75.0000 mg | ORAL_CAPSULE | Freq: Two times a day (BID) | ORAL | Status: DC
  Administered 2024-02-08 – 2024-02-11 (×6): 75 mg via ORAL
  Filled 2024-02-08 (×6): qty 1

## 2024-02-08 MED ORDER — ACETAMINOPHEN 650 MG RE SUPP: 650.0000 mg | Freq: Four times a day (QID) | RECTAL | Status: DC | PRN

## 2024-02-08 NOTE — Assessment & Plan Note (Signed)
 Potassium 3.2 -Check magnesium

## 2024-02-08 NOTE — ED Provider Notes (Signed)
 [place] EMERGENCY DEPARTMENT AT [facility] Provider Note   CSN: [id number] Arrival date & time: 02/08/24  1111     History  Chief Complaint  Patient presents with   Hip Pain    Elaine Morgan is a 53 y.o. female.  Patient brought in by ambulance after being found outside lying on the ground.  Patient complaining of severe pain in both of her hips.  She said it has been there since September.  She denies any recent drug use.  She is not very cooperative with history or exam.  Very agitated and writhing around.   The history is provided by the patient and the EMS personnel.  Hip Pain This is a new problem. Episode onset: september. The problem has been gradually worsening. Pertinent negatives include no chest pain, no abdominal pain, no headaches and no shortness of breath. The symptoms are aggravated by walking. Nothing relieves the symptoms. Treatments tried: nsaids. The treatment provided no relief.       Home Medications Prior to Admission medications   Medication Sig Start Date End Date Taking? Authorizing Provider  cephALEXin (KEFLEX) 500 MG capsule Take 1 capsule (500 mg total) by mouth 4 (four) times daily. 11/26/22   Burgess Amor, PA-C  ondansetron (ZOFRAN) 4 MG tablet Take 1 tablet (4 mg total) by mouth every 6 (six) hours. 11/26/22   Burgess Amor, PA-C  oxyCODONE-acetaminophen (PERCOCET/ROXICET) 5-325 MG tablet Take 1 tablet by mouth every 6 (six) hours as needed. 11/26/22   Burgess Amor, PA-C  tamsulosin (FLOMAX) 0.4 MG CAPS capsule Take 1 capsule (0.4 mg total) by mouth daily after supper. 11/26/22   Burgess Amor, PA-C      Allergies    Hydrocodone    Review of Systems   Review of Systems  Respiratory:  Negative for shortness of breath.   Cardiovascular:  Negative for chest pain.  Gastrointestinal:  Negative for abdominal pain.  Neurological:  Negative for headaches.    Physical Exam Updated Vital Signs BP (!) 134/94 (BP Location: Left Arm)   Pulse 95    Temp (!) 100.8 F (38.2 C) (Oral)   Resp 20   LMP 01/13/2017   SpO2 96%  Physical Exam Vitals and nursing note reviewed.  Constitutional:      General: She is in acute distress.     Appearance: Normal appearance. She is well-developed.  HENT:     Head: Normocephalic and atraumatic.  Eyes:     Conjunctiva/sclera: Conjunctivae normal.  Cardiovascular:     Rate and Rhythm: Normal rate and regular rhythm.     Heart sounds: No murmur heard. Pulmonary:     Effort: Pulmonary effort is normal. No respiratory distress.     Breath sounds: Normal breath sounds.  Abdominal:     Palpations: Abdomen is soft.     Tenderness: There is no abdominal tenderness. There is no guarding or rebound.  Musculoskeletal:        General: Tenderness (vague both hips) present. No swelling or deformity.     Cervical back: Neck supple.  Skin:    General: Skin is warm and dry.     Capillary Refill: Capillary refill takes less than 2 seconds.  Neurological:     General: No focal deficit present.     Mental Status: She is alert.     Sensory: No sensory deficit.     Motor: No weakness.     ED Results / Procedures / Treatments   Labs (all labs  ordered are listed, but only abnormal results are displayed) Labs Reviewed  RESP PANEL BY RT-PCR (RSV, FLU A&B, COVID)  RVPGX2 - Abnormal; Notable for the following components:      Result Value   Influenza A by PCR POSITIVE (*)    All other components within normal limits  COMPREHENSIVE METABOLIC PANEL - Abnormal; Notable for the following components:   Potassium 3.2 (*)    Calcium 8.6 (*)    Albumin 3.4 (*)    All other components within normal limits  CBC WITH DIFFERENTIAL/PLATELET - Abnormal; Notable for the following components:   RBC 3.86 (*)    HCT 35.1 (*)    Platelets 111 (*)    All other components within normal limits  SEDIMENTATION RATE - Abnormal; Notable for the following components:   Sed Rate 23 (*)    All other components within normal  limits  MAGNESIUM - Abnormal; Notable for the following components:   Magnesium 1.4 (*)    All other components within normal limits  CULTURE, BLOOD (ROUTINE X 2)  CULTURE, BLOOD (ROUTINE X 2)  ETHANOL  LACTIC ACID, PLASMA  GLUCOSE, CAPILLARY  URINALYSIS, ROUTINE W REFLEX MICROSCOPIC  RAPID URINE DRUG SCREEN, HOSP PERFORMED  C-REACTIVE PROTEIN  PROCALCITONIN    EKG EKG Interpretation Date/Time:  Monday February 08 2024 14:14:13 EST Ventricular Rate:  89 PR Interval:  126 QRS Duration:  82 QT Interval:  344 QTC Calculation: 418 R Axis:   55  Text Interpretation: Normal sinus rhythm Septal infarct , age undetermined Abnormal ECG When compared with ECG of 23-Nov-2023 00:00, No significant change since last tracing Confirmed by Meridee Score [phone]) on 02/08/2024 2:15:32 PM  Radiology DG HIPS BILAT WITH PELVIS MIN 5 VIEWS Result Date: 02/08/2024 CLINICAL DATA:  Fever status post fall EXAM: DG HIP (WITH OR WITHOUT PELVIS) 5V BILAT COMPARISON:  CT abdomen and pelvis dated 11/26/2022 FINDINGS: There is no evidence of acute displaced hip fracture or dislocation. Severe degenerative changes of bilateral hips. IMPRESSION: 1. No acute displaced hip fracture or dislocation. 2. Severe degenerative changes of bilateral hips. Electronically Signed   By: Agustin Cree M.D.   On: 02/08/2024 15:06   DG Chest 1 View Result Date: 02/08/2024 CLINICAL DATA:  Fever EXAM: CHEST  1 VIEW COMPARISON:  Chest radiograph dated 05/29/2022 FINDINGS: Mildly low lung volumes. The inferior most right costophrenic angle is not entirely included within the field of view. Multifocal patchy lung space opacities, left-greater-than-right. No pleural effusion or pneumothorax. The heart size and mediastinal contours are within normal limits. No acute osseous abnormality. IMPRESSION: Multifocal patchy lung space opacities, left-greater-than-right, suspicious for multifocal pneumonia. Electronically Signed   By: Agustin Cree M.D.   On:  02/08/2024 15:04    Procedures .Critical Care  Performed by: Terrilee Files, MD Authorized by: Terrilee Files, MD   Critical care provider statement:    Critical care time (minutes):  45   Critical care time was exclusive of:  Separately billable procedures and treating other patients   Critical care was necessary to treat or prevent imminent or life-threatening deterioration of the following conditions:  CNS failure or compromise   Critical care was time spent personally by me on the following activities:  Development of treatment plan with patient or surrogate, discussions with consultants, evaluation of patient's response to treatment, examination of patient, obtaining history from patient or surrogate, ordering and performing treatments and interventions, ordering and review of laboratory studies, ordering and review of radiographic studies,  pulse oximetry, re-evaluation of patient's condition and review of old charts   I assumed direction of critical care for this patient from another provider in my specialty: no       Medications Ordered in ED Medications  dextrose 5 % and 0.9 % NaCl with KCl 40 mEq/L infusion (has no administration in time range)  oseltamivir (TAMIFLU) capsule 75 mg (has no administration in time range)  morphine (PF) 4 MG/ML injection 4 mg (4 mg Intravenous Given 02/08/24 1226)  LORazepam (ATIVAN) injection 1 mg (1 mg Intravenous Given 02/08/24 1226)  acetaminophen (TYLENOL) tablet 650 mg (650 mg Oral Given 02/08/24 1423)  oseltamivir (TAMIFLU) capsule 75 mg (75 mg Oral Given 02/08/24 1424)  cefTRIAXone (ROCEPHIN) 1 g in sodium chloride 0.9 % 100 mL IVPB (1 g Intravenous New Bag/Given 02/08/24 1600)  azithromycin (ZITHROMAX) 500 mg in sodium chloride 0.9 % 250 mL IVPB (500 mg Intravenous New Bag/Given 02/08/24 1600)  sodium chloride 0.9 % bolus 500 mL (500 mLs Intravenous New Bag/Given 02/08/24 1644)    ED Course/ Medical Decision Making/ A&P Clinical Course as of  02/08/24 1708  Mon Feb 08, 2024  1338 Patient bilateral hip x-ray showing significant arthritis right greater than left.  Awaiting radiology reading. [MB]  1339 Chest x-ray showing probable left lower lobe infiltrate.  Flu test came back positive. [MB]  1340 Platelets low at 111,000.  They were down before but had normalized recently.  [MB]  1525 Discussed with [facility] as well as Dr. Mariea Clonts who will evaluate patient for admission. [MB]    Clinical Course User Index [MB] Terrilee Files, MD                                 Medical Decision Making Amount and/or Complexity of Data Reviewed Labs: ordered. Radiology: ordered.  Risk OTC drugs. Prescription drug management. Decision regarding hospitalization.   This patient complains of bilateral hip pain, agitation; this involves an extensive number of treatment Options and is a complaint that carries with it a high risk of complications and morbidity. The differential includes intoxication, withdrawal, toxidrome, sepsis, Sirs, septic joint, fracture, dislocation  I ordered, reviewed and interpreted labs, which included CBC with normal white count, low platelets, chemistries with low potassium normal renal function, lactate normal, sed rate mildly elevated, alcohol negative, influenza positive, talk screen pending, blood culture sent I ordered medication Tylenol for fever, IV fluids Tamiflu IV antibiotics medication for pain and agitation and reviewed PMP when indicated. I ordered imaging studies which included chest x-ray and x-ray of her hips and I independently    visualized and interpreted imaging which showed significant degenerative changes in hips, does have signs of left lower lobe pneumonia Additional history obtained from EMS Previous records obtained and reviewed in epic patient is frequently in the ED for various painful conditions and drug use I consulted [facility] hospitalist Dr. Mariea Clonts and discussed lab and imaging findings and  discussed disposition.  Cardiac monitoring reviewed, sinus rhythm Social determinants considered, tobacco use Critical Interventions: Workup of patient's febrile illness requiring consideration for sepsis, septic joint  After the interventions stated above, I reevaluated the patient and found patient to be less agitated although still without a completely clear sensorium Admission and further testing considered, due to her unreliable nature do not feel she can manage treatment for influenza and pneumonia as an outpatient at this time.  Will admit for further management.  Final Clinical Impression(s) / ED Diagnoses Final diagnoses:  Community acquired pneumonia of left lower lobe of lung  Influenza  Bilateral hip pain  Acute encephalopathy    Rx / DC Orders ED Discharge Orders     None         Terrilee Files, MD 02/08/24 1712

## 2024-02-08 NOTE — Assessment & Plan Note (Addendum)
 T-max 101.6, WBC 6.9.  Chest xray with multifocal pneumonia.  O2 sats 90 to 96% on room air. -Tamiflu -Check procalcitonin -IV ceftriaxone and azithromycin, continue for now - Hydrate

## 2024-02-08 NOTE — Assessment & Plan Note (Addendum)
 Bilateral hip pain for about 5 to 6 months now.  Pelvic x-ray shows severe degenerative changes of bilateral hips, no acute displaced fracture or dislocation.   -Considering age, need to follow-up as outpatient to rule out other etiology for arthritis -ESR CRP ordered in ED, will likely be elevated in the setting of acute viral infection and possibly bacterial co-infection. - hydrocodone -acetaminophen 5/325 q6hr

## 2024-02-08 NOTE — H&P (Signed)
 History and Physical    Elaine Morgan:[id number] DOB: 1971-10-31 DOA: 02/08/2024  PCP: Patient, No Pcp Per   Patient coming from: Home  I have personally briefly reviewed patient's old medical records in [facility]  Chief Complaint: Bilat hip pain  HPI: Elaine Morgan is a 53 y.o. female with medical history significant for cocaine abuse, kidney stones. Patient was brought to the ED via EMS, patient was found lying on the ground by the road.  My evaluation, patient is status post Ativan, intermittently arouses, and goes back to sleep unable to provide history.   On initial arrival to the ED, patient was at some point less responsive, then agitated, restless, writhing, complaining of bilateral hip pain.  She reported hip pain has been ongoing since September.  ED Course: Febrile to 101.6.  Heart rate 87-95.  Respiratory 16-20.  Blood pressure systolic 1 34-140.  O2 sats greater than 92% on room air. Flu positive Blood alcohol level less than 10.  Lactic acid 1.1. Chest x-ray shows multifocal lung opacities left greater than right. Pelvic x-ray shows severe degenerative changes of bilateral hip. Morphine 4 mg, and Ativan 1 mg given.  Tamiflu given.  IV ceftriaxone and azithromycin.  Review of Systems: Unable to ascertain due to altered mental status  Past Medical History:  Diagnosis Date   Chronic right shoulder pain    Complication of anesthesia    ' I have a hard time breathing after surgery"   Kidney stone    Kidney stone     Past Surgical History:  Procedure Laterality Date   ANKLE FRACTURE SURGERY Left    CYSTOSCOPY WITH STENT PLACEMENT Left 11/28/2013   Procedure: CYSTOSCOPY WITH STENT PLACEMENT;  Surgeon: Ky Barban, MD;  Location: [facility];  Service: Urology;  Laterality: Left;   EXTRACORPOREAL SHOCK WAVE LITHOTRIPSY Left 01/11/2014   Procedure: EXTRACORPOREAL SHOCK WAVE LITHOTRIPSY (ESWL) LEFT RENAL CALCULUS;  Surgeon: Ky Barban, MD;  Location: [facility];   Service: Urology;  Laterality: Left;   FRACTURE SURGERY     ORIF MANDIBULAR FRACTURE Bilateral 10/07/2013   Procedure: BILATERAL ORIF MANDIBULAR FRACTURE INTERMAXILLARY FIXATION/EXTRACTION OF TOOTH;  Surgeon: Glenna Fellows, MD;  Location: [facility];  Service: Plastics;  Laterality: Bilateral;  ANESTHESIA: NASO TRACHEAL INTUBATION   TUBAL LIGATION       reports that she has been smoking cigarettes. She has a 25 pack-year smoking history. She has never used smokeless tobacco. She reports that she does not currently use alcohol. She reports current drug use. Drugs: "Crack" cocaine and Amphetamines.  Allergies  Allergen Reactions   Hydrocodone Itching and Nausea And Vomiting    History reviewed. No pertinent family history.  Prior to Admission medications   Medication Sig Start Date End Date Taking? Authorizing Provider  cephALEXin (KEFLEX) 500 MG capsule Take 1 capsule (500 mg total) by mouth 4 (four) times daily. 11/26/22   Burgess Amor, PA-C  ondansetron (ZOFRAN) 4 MG tablet Take 1 tablet (4 mg total) by mouth every 6 (six) hours. 11/26/22   Burgess Amor, PA-C  oxyCODONE-acetaminophen (PERCOCET/ROXICET) 5-325 MG tablet Take 1 tablet by mouth every 6 (six) hours as needed. 11/26/22   Burgess Amor, PA-C  tamsulosin (FLOMAX) 0.4 MG CAPS capsule Take 1 capsule (0.4 mg total) by mouth daily after supper. 11/26/22   Burgess Amor, PA-C    Physical Exam: Limited exam due to altered mental status Vitals:   02/08/24 1128 02/08/24 1417 02/08/24 1518 02/08/24 1520  BP: Marland Kitchen)  134/94 (!) 140/80 113/76   Pulse: 95 87  85  Resp: 20 16  12   Temp: (!) 100.8 F (38.2 C) (!) 101.6 F (38.7 C)    TempSrc: Oral Oral    SpO2: 96% 92%  91%    Constitutional: NAD, calm, comfortable Vitals:   02/08/24 1128 02/08/24 1417 02/08/24 1518 02/08/24 1520  BP: (!) 134/94 (!) 140/80 113/76   Pulse: 95 87  85  Resp: 20 16  12   Temp: (!) 100.8 F (38.2 C) (!) 101.6 F (38.7 C)    TempSrc: Oral Oral    SpO2: 96%  92%  91%   Eyes: PERRL, lids and conjunctivae normal ENMT: Mucous membranes are moist..  Neck: normal, supple, no masses, no thyromegaly Respiratory: Upper airway transmitted sounds,  no wheezing, no crackles. Normal respiratory effort. No accessory muscle use.  Cardiovascular: Regular rate and rhythm, no murmurs / rubs / gallops. No extremity edema.  Extremities warm. Abdomen: no tenderness, no masses palpated. No hepatosplenomegaly. Bowel sounds positive.  Musculoskeletal: no clubbing / cyanosis. No joint deformity upper and lower extremities. Skin: no rashes, lesions, ulcers. No induration Neurologic: Limited exam due to altered mental status, no facial asymmetry, moving all extremities.   Psychiatric:  Status post morphine and Ativan, intermittently agitated, spontaneously gets up without stimulation, restless, and drifts back to sleep with answering questions.  Labs on Admission: I have personally reviewed following labs and imaging studies  CBC: Recent Labs  Lab 02/08/24 1216  WBC 6.9  NEUTROABS 5.3  HGB 12.2  HCT 35.1*  MCV 90.9  PLT 111*   Basic Metabolic Panel: Recent Labs  Lab 02/08/24 1216  NA 140  K 3.2*  CL 105  CO2 22  GLUCOSE 83  BUN 16  CREATININE 0.67  CALCIUM 8.6*   GFR: CrCl cannot be calculated (Unknown ideal weight.). Liver Function Tests: Recent Labs  Lab 02/08/24 1216  AST 41  ALT 31  ALKPHOS 61  BILITOT 0.4  PROT 7.0  ALBUMIN 3.4*   Radiological Exams on Admission: DG HIPS BILAT WITH PELVIS MIN 5 VIEWS Result Date: 02/08/2024 CLINICAL DATA:  Fever status post fall EXAM: DG HIP (WITH OR WITHOUT PELVIS) 5V BILAT COMPARISON:  CT abdomen and pelvis dated 11/26/2022 FINDINGS: There is no evidence of acute displaced hip fracture or dislocation. Severe degenerative changes of bilateral hips. IMPRESSION: 1. No acute displaced hip fracture or dislocation. 2. Severe degenerative changes of bilateral hips. Electronically Signed   By: Agustin Cree M.D.    On: 02/08/2024 15:06   DG Chest 1 View Result Date: 02/08/2024 CLINICAL DATA:  Fever EXAM: CHEST  1 VIEW COMPARISON:  Chest radiograph dated 05/29/2022 FINDINGS: Mildly low lung volumes. The inferior most right costophrenic angle is not entirely included within the field of view. Multifocal patchy lung space opacities, left-greater-than-right. No pleural effusion or pneumothorax. The heart size and mediastinal contours are within normal limits. No acute osseous abnormality. IMPRESSION: Multifocal patchy lung space opacities, left-greater-than-right, suspicious for multifocal pneumonia. Electronically Signed   By: Agustin Cree M.D.   On: 02/08/2024 15:04    EKG: Independently reviewed.  Sinus rhythm, rate 89, QTc 418.  No significant change from prior.  Assessment/Plan Principal Problem:   Influenza A with pneumonia Active Problems:   Acute metabolic encephalopathy   Cocaine-induced mood disorder (HCC)   Degenerative joint disease of both hips   Hypokalemia  Assessment and Plan: * Influenza A with pneumonia T-max 101.6, WBC 6.9.  Chest xray with multifocal  pneumonia.  O2 sats 90 to 96% on room air. -Tamiflu -Check procalcitonin -IV ceftriaxone and azithromycin, continue for now - Hydrate  Acute metabolic encephalopathy Patient found on the streets lying on the floor.  Likely substance use-history of cocaine abuse, ?? polysubstance abuse, flank infection and multifocal pneumonia. -UDS, UA pending - bolus, continue D5 N/s + 20kcl 100cc/hr x 15hrs -Obtain head CT  Degenerative joint disease of both hips Bilateral hip pain for about 5 to 6 months now.  Pelvic x-ray shows severe degenerative changes of bilateral hips, no acute displaced fracture or dislocation.   -Considering age, need to follow-up as outpatient to rule out other etiology for arthritis -ESR CRP ordered in ED, will likely be elevated in the setting of acute viral infection and possibly bacterial co-infection. -  hydrocodone -acetaminophen 5/325 q6hr   Cocaine-induced mood disorder (HCC) Altered mental status likely due to withdrawal or intoxication from substance. Blood alcohol level less than 10.   -??  Polysubstance abuse. - Follow up UDS - Ativan 0.5 q6hr prn  Hypokalemia Potassium 3.2. -Check magnesium   DVT prophylaxis: Lovenox Code Status:  FULL code Family Communication: None at bedside Disposition Plan:  ~ 2 days Consults called: None Admission status: Inpt tele  I certify that at the point of admission it is my clinical judgment that the patient will require inpatient hospital care spanning beyond 2 midnights from the point of admission due to high intensity of service, high risk for further deterioration and high frequency of surveillance required.   Author: Onnie Boer, MD 02/08/2024 4:22 PM  For on call review [email or web address].

## 2024-02-08 NOTE — Assessment & Plan Note (Addendum)
 Altered mental status likely due to withdrawal or intoxication from substance. Blood alcohol level less than 10.   -??  Polysubstance abuse. - Follow up UDS - Ativan 0.5 q6hr prn

## 2024-02-08 NOTE — Assessment & Plan Note (Addendum)
 Patient found on the streets lying on the floor.  Likely substance use-history of cocaine abuse, ?? polysubstance abuse, flank infection and multifocal pneumonia. -UDS, UA pending - bolus, continue D5 N/s + 20kcl 100cc/hr x 15hrs -Obtain head CT

## 2024-02-08 NOTE — ED Triage Notes (Signed)
 Pt BIB [facility] for c/o hip pain; ems reports they found pt lying on the ground   Pt is moving all extremities and unable to be still during triage  Pt c/o left hip pain, pt denies taking any drugs in the last 20 days

## 2024-02-09 DIAGNOSIS — J189 Pneumonia, unspecified organism: Secondary | ICD-10-CM

## 2024-02-09 DIAGNOSIS — J111 Influenza due to unidentified influenza virus with other respiratory manifestations: Secondary | ICD-10-CM

## 2024-02-09 LAB — BASIC METABOLIC PANEL
Anion gap: 6 (ref 5–15)
BUN: 13 mg/dL (ref 6–20)
CO2: 20 mmol/L — ABNORMAL LOW (ref 22–32)
Calcium: 7.5 mg/dL — ABNORMAL LOW (ref 8.9–10.3)
Chloride: 111 mmol/L (ref 98–111)
Creatinine, Ser: 0.67 mg/dL (ref 0.44–1.00)
GFR, Estimated: >60 mL/min (ref >60)
Glucose, Bld: 92 mg/dL (ref 70–99)
Potassium: 3.4 mmol/L — ABNORMAL LOW (ref 3.5–5.1)
Sodium: 137 mmol/L (ref 135–145)

## 2024-02-09 LAB — HIV ANTIBODY (ROUTINE TESTING W REFLEX): HIV Screen 4th Generation wRfx: NONREACTIVE

## 2024-02-09 LAB — CBC
HCT: 32.8 % — ABNORMAL LOW (ref 36.0–46.0)
Hemoglobin: 11.3 g/dL — ABNORMAL LOW (ref 12.0–15.0)
MCH: 31.4 pg (ref 26.0–34.0)
MCHC: 34.5 g/dL (ref 30.0–36.0)
MCV: 91.1 fL (ref 80.0–100.0)
Platelets: 92 10*3/uL — ABNORMAL LOW (ref 150–400)
RBC: 3.6 MIL/uL — ABNORMAL LOW (ref 3.87–5.11)
RDW: 12.3 % (ref 11.5–15.5)
WBC: 8.7 10*3/uL (ref 4.0–10.5)
nRBC: 0 % (ref 0.0–0.2)

## 2024-02-09 MED ORDER — MAGNESIUM SULFATE IN D5W 1-5 GM/100ML-% IV SOLN
1.0000 g | Freq: Once | INTRAVENOUS | Status: AC
  Administered 2024-02-09: 1 g via INTRAVENOUS
  Filled 2024-02-09: qty 100

## 2024-02-09 MED ORDER — ORAL CARE MOUTH RINSE: 15.0000 mL | OROMUCOSAL | Status: DC | PRN

## 2024-02-09 MED ORDER — POTASSIUM CHLORIDE CRYS ER 20 MEQ PO TBCR
40.0000 meq | EXTENDED_RELEASE_TABLET | Freq: Once | ORAL | Status: AC
  Administered 2024-02-09: 40 meq via ORAL
  Filled 2024-02-09: qty 2

## 2024-02-09 MED ORDER — LORATADINE 10 MG PO TABS
10.0000 mg | ORAL_TABLET | Freq: Every day | ORAL | Status: DC
  Administered 2024-02-09 – 2024-02-11 (×3): 10 mg via ORAL
  Filled 2024-02-09 (×3): qty 1

## 2024-02-09 NOTE — Progress Notes (Signed)
 Progress Note   Patient: Elaine Morgan ZOX:[id number] DOB: June 08, 1971 DOA: 02/08/2024     1 DOS: the patient was seen and examined on 02/09/2024   Brief hospital admission narrative: As per H&P written by Dr. Mariea Clonts on 02/08/2024 Elaine Morgan is a 53 y.o. female with medical history significant for cocaine abuse, kidney stones. Patient was brought to the ED via EMS, patient was found lying on the ground by the road.  My evaluation, patient is status post Ativan, intermittently arouses, and goes back to sleep unable to provide history.   On initial arrival to the ED, patient was at some point less responsive, then agitated, restless, writhing, complaining of bilateral hip pain.  She reported hip pain has been ongoing since September.   ED Course: Febrile to 101.6.  Heart rate 87-95.  Respiratory 16-20.  Blood pressure systolic 1 34-140.  O2 sats greater than 92% on room air. Flu positive Blood alcohol level less than 10.  Lactic acid 1.1. Chest x-ray shows multifocal lung opacities left greater than right. Pelvic x-ray shows severe degenerative changes of bilateral hip. Morphine 4 mg, and Ativan 1 mg given.  Tamiflu given.  IV ceftriaxone and azithromycin.  Assessment and Plan: * Influenza A with pneumonia -T-max 101.6, Chest xray with multifocal pneumonia.  O2 sats 90 to 96% on room air. -Patient reports feeling better -CRP 4.5; procalcitonin <0.10; ESR 23 -Continue treatment with Tamiflu, continue IV antibiotics -Maintain adequate hydration -As needed bronchodilators and mucolytic's will be provided -Follow clinical response.  Acute metabolic encephalopathy -Patient found on the streets lying on the floor.  Likely substance use-history of cocaine abuse. -UDS pending -Continue to maintain hydration -Patient mentation improved and essentially back to baseline -Continue constant reorientation. -Continue as needed antipyretics and continue treatment for infection as mentioned  above.  Degenerative joint disease of both hips -Bilateral hip pain for about 5 to 6 months now as per patient reports.  Pelvic x-ray shows severe degenerative changes of bilateral hips, no acute displaced fracture or dislocation.   -Continue as needed analgesics -Outpatient follow-up with orthopedic service recommended.  Cocaine-induced mood disorder (HCC) -Continue supportive care -Cessation counseling provided.  Hypokalemia/hypomagnesemia -Continue to follow ultralights trend and further replete as needed.   Subjective:  Currently afebrile; low-grade temperature appreciated overnight.  Patient expressing being tachypneic and short winded with activity.  No chest pain.  Following commands appropriately.  Reports no nausea vomiting  Physical Exam: Vitals:   02/09/24 0804 02/09/24 0900 02/09/24 1000 02/09/24 1422  BP: 102/66  106/68 132/84  Pulse: 70  73 68  Resp: (!) 26 (!) 24 20   Temp: 99.6 F (37.6 C)  98.9 F (37.2 C) 98.3 F (36.8 C)  TempSrc: Tympanic   Oral  SpO2: 93%  95% 93%  Weight:      Height:       General exam: Alert, awake, oriented x 3; following commands appropriately and in no acute distress.  Reports feeling short winded with activity, experiencing intermittent coughing spells and having nasal congestion. Respiratory system: Good saturation on room air; positive rhonchi bilaterally. Cardiovascular system:RRR. No rubs or gallops; no JVD. Gastrointestinal system: Abdomen is nondistended, soft and nontender. No organomegaly or masses felt. Normal bowel sounds heard. Central nervous system:  No focal neurological deficits. Extremities: No cyanosis, clubbing or edema. Skin: No petechiae. Psychiatry: Judgement and insight appear normal. Mood & affect appropriate.   Data Reviewed: CBC: WBCs 8.7, hemoglobin 11.3 and platelet count 92K Basic metabolic panel:  Sodium 137, potassium 3.4, iron 111, bicarb 20, BUN 13, creatinine 0.67 and GFR>60  Family  Communication: No family at bedside.  Disposition: Status is: Inpatient Remains inpatient appropriate because: Continue IV antibiotics.   Planned Discharge Destination: Home   Time spent: 50 minutes  Author: Vassie Loll, MD 02/09/2024 5:09 PM  For on call review [email or web address].

## 2024-02-09 NOTE — Plan of Care (Signed)
   Problem: Activity: Goal: Risk for activity intolerance will decrease Outcome: Progressing   Problem: Coping: Goal: Level of anxiety will decrease Outcome: Progressing

## 2024-02-09 NOTE — Progress Notes (Signed)
 Pt has no insurance and no PCP. Discussed referral to [facility] and pt is agreeable. Referral made. Pt also reports she doesn't have stable housing. She may be able to stay with someone when she leaves hospital and will be working on that. LCSW will add shelter list to AVS.    02/09/24 0832  TOC Brief Assessment  Insurance and Status Lapsed  Patient has primary care physician No  Home environment has been reviewed Homeless.  Prior level of function: Independent.  Prior/Current Home Services No current home services  Social Drivers of Health Review SDOH reviewed interventions complete  Readmission risk has been reviewed Yes  Transition of care needs transition of care needs identified, TOC will continue to follow

## 2024-02-09 NOTE — Discharge Instructions (Signed)
 Homelessness: Agency Name: [facility] Address: [address], [place], [place] [address] Phone: [phone] Website: www.[facility].org Service(s) Offered: Transitional and Permanent Housing to the homeless, support  services, emergency utility assistance, food assistance and  support circles  March 30, 2023 8 Agency Name: [facility] Address: [address], [place], [place] [address] Phone: [phone] Website: www.[facility].com Service(s) Offered: Food pantry Tuesday, Wednesday and Thursday 9am-11:30am  (need appointment) and health clinic (9:00am-11:00am) Agency Name: [facility] Address: [address], [place], [place] [address] Phone: [phone] Website: www.[email or web address].org Service(s) Offered: Housing, meals, job training, transportation & referrals

## 2024-02-10 LAB — URINALYSIS, ROUTINE W REFLEX MICROSCOPIC
Bilirubin Urine: NEGATIVE
Glucose, UA: NEGATIVE mg/dL
Hgb urine dipstick: NEGATIVE
Ketones, ur: NEGATIVE mg/dL
Nitrite: NEGATIVE
Protein, ur: 30 mg/dL — AB
Specific Gravity, Urine: 1.021 (ref 1.005–1.030)
pH: 5 (ref 5.0–8.0)

## 2024-02-10 LAB — MAGNESIUM: Magnesium: 1.9 mg/dL (ref 1.7–2.4)

## 2024-02-10 LAB — RAPID URINE DRUG SCREEN, HOSP PERFORMED
Amphetamines: NOT DETECTED
Barbiturates: NOT DETECTED
Benzodiazepines: NOT DETECTED
Cocaine: POSITIVE — AB
Opiates: POSITIVE — AB
Tetrahydrocannabinol: POSITIVE — AB

## 2024-02-10 LAB — CBC
HCT: 31.7 % — ABNORMAL LOW (ref 36.0–46.0)
Hemoglobin: 10.8 g/dL — ABNORMAL LOW (ref 12.0–15.0)
MCH: 31.7 pg (ref 26.0–34.0)
MCHC: 34.1 g/dL (ref 30.0–36.0)
MCV: 93 fL (ref 80.0–100.0)
Platelets: 87 10*3/uL — ABNORMAL LOW (ref 150–400)
RBC: 3.41 MIL/uL — ABNORMAL LOW (ref 3.87–5.11)
RDW: 12.7 % (ref 11.5–15.5)
WBC: 6.5 10*3/uL (ref 4.0–10.5)
nRBC: 0 % (ref 0.0–0.2)

## 2024-02-10 LAB — BASIC METABOLIC PANEL
Anion gap: 4 — ABNORMAL LOW (ref 5–15)
BUN: 14 mg/dL (ref 6–20)
CO2: 22 mmol/L (ref 22–32)
Calcium: 7.9 mg/dL — ABNORMAL LOW (ref 8.9–10.3)
Chloride: 111 mmol/L (ref 98–111)
Creatinine, Ser: 0.68 mg/dL (ref 0.44–1.00)
GFR, Estimated: >60 mL/min (ref >60)
Glucose, Bld: 86 mg/dL (ref 70–99)
Potassium: 3.6 mmol/L (ref 3.5–5.1)
Sodium: 137 mmol/L (ref 135–145)

## 2024-02-10 NOTE — Progress Notes (Signed)
 Mobility Specialist Progress Note:    02/10/24 1135  Mobility  Activity Refused mobility   Pt politely refused mobility for no specific reason other than not wanting to. All needs met.   Lawerance Bach Mobility Specialist Please contact via Special educational needs teacher or  Rehab office at [phone]

## 2024-02-10 NOTE — Plan of Care (Signed)

## 2024-02-10 NOTE — Progress Notes (Signed)
  Progress Note   Patient: Elaine Morgan MVH:[id number] DOB: 31-Aug-1971 DOA: 02/08/2024     2 DOS: the patient was seen and examined on 02/10/2024   Brief hospital admission narrative: As per H&P written by Dr. Mariea Clonts on 02/08/2024 Elaine Morgan is a 53 y.o. female with medical history significant for cocaine abuse, kidney stones. Patient was brought to the ED via EMS, patient was found lying on the ground by the road.  My evaluation, patient is status post Ativan, intermittently arouses, and goes back to sleep unable to provide history.   On initial arrival to the ED, patient was at some point less responsive, then agitated, restless, writhing, complaining of bilateral hip pain.  She reported hip pain has been ongoing since September.   ED Course: Febrile to 101.6.  Heart rate 87-95.  Respiratory 16-20.  Blood pressure systolic 1 34-140.  O2 sats greater than 92% on room air. Flu positive Blood alcohol level less than 10.  Lactic acid 1.1. Chest x-ray shows multifocal lung opacities left greater than right. Pelvic x-ray shows severe degenerative changes of bilateral hip. Morphine 4 mg, and Ativan 1 mg given.  Tamiflu given.  IV ceftriaxone and azithromycin.  Assessment and Plan: 1)Influenza A with pneumonia with transient acute hypoxic respiratory failure--POA -On admission T-max 101.6,  --Chest xray with multifocal pneumonia.  -CRP 4.5; procalcitonin <0.10; ESR 23 -Continue treatment with Tamiflu, -Cough and dyspnea persist -Continue azithromycin and Rocephin  -Continue bronchodilators and mucolytics -Weaning off oxygen nicely.  2)Acute metabolic encephalopathy--polysubstance abuse related -Patient found on the streets lying on the floor.    -UDS with opiates, cocaine and THC -Mentation is back to baseline  3)Degenerative joint disease of both hips -Bilateral hip pain for about 5 to 6 months now as per patient reports.  Pelvic x-ray shows severe degenerative changes of bilateral hips,  no acute displaced fracture or dislocation.   -Continue as needed analgesics -Outpatient follow-up with orthopedic service recommended.  4)Cocaine-induced mood disorder (HCC) -Patient declines referral for drug rehab  5)Hypokalemia/hypomagnesemia -Replaced and normalized   Subjective:  -Dyspnea and cough persist -Fevers -Weaning off oxygen nicely  Physical Exam: Vitals:   02/09/24 1800 02/09/24 1954 02/10/24 0428 02/10/24 0940  BP:  130/87 119/84 119/73  Pulse:  71 67 75  Resp: (!) 23 20 19    Temp:  99.1 F (37.3 C) 98.3 F (36.8 C)   TempSrc:  Oral    SpO2:  96% 91% 93%  Weight:      Height:        Physical Exam  Gen:- Awake Alert, in no acute distress  HEENT:- [place].AT, No sclera icterus Neck-Supple Neck,No JVD,.  Lungs-no significant wheezing, fair air movement bilaterally  CV- S1, S2 normal, RRR Abd-  +ve B.Sounds, Abd Soft, No tenderness,    Extremity/Skin:- No  edema,   good pedal pulses  Psych-affect is appropriate, oriented x3 Neuro-no new focal deficits, no tremors    Family Communication: No family at bedside.  Disposition: Home Status is: Inpatient Remains inpatient appropriate because: Continue IV antibiotics.  Author: Shon Hale, MD 02/10/2024 4:45 PM  For on call review [email or web address].

## 2024-02-11 LAB — URINE CULTURE: Culture: NO GROWTH

## 2024-02-11 MED ORDER — OSELTAMIVIR PHOSPHATE 75 MG PO CAPS: 75.0000 mg | ORAL_CAPSULE | Freq: Two times a day (BID) | ORAL | 0 refills | Status: AC

## 2024-02-11 MED ORDER — AZITHROMYCIN 500 MG PO TABS: 500.0000 mg | ORAL_TABLET | Freq: Every day | ORAL | 0 refills | Status: AC

## 2024-02-11 MED ORDER — GUAIFENESIN ER 600 MG PO TB12: 600.0000 mg | ORAL_TABLET | Freq: Two times a day (BID) | ORAL | 2 refills | Status: AC

## 2024-02-11 MED ORDER — ACETAMINOPHEN 325 MG PO TABS: 650.0000 mg | ORAL_TABLET | Freq: Four times a day (QID) | ORAL | Status: AC | PRN

## 2024-02-11 MED ORDER — CEFDINIR 300 MG PO CAPS: 300.0000 mg | ORAL_CAPSULE | Freq: Two times a day (BID) | ORAL | 0 refills | Status: AC

## 2024-02-11 NOTE — Progress Notes (Signed)
 This nurse and NT was asked to do O2 walk test on pt. I explained to pt that we needed to check her oxygen while she ambulated to make sure that she didn't need any oxygen at discharge. Pt took pulse ox off of finger and stated, " I'm not walking anywhere". Attempted to redirect pt, with no success. Pt's primary nurse, Wandra Mannan and Courage, MD made aware.

## 2024-02-11 NOTE — Plan of Care (Signed)

## 2024-02-11 NOTE — Discharge Summary (Signed)
 Elaine Morgan, is a 53 y.o. female  DOB March 23, 1971  MRN [id number].  Admission date:  02/08/2024  Admitting Physician  Onnie Boer, MD  Discharge Date:  02/11/2024   Primary MD  Patient, No Pcp Per  Recommendations for primary care physician for things to follow:  1)Please Take Medications as prescribed 2)Avoid Dehydration----Drink Plenty Fluids 3)Smoking cessation advised----- Use nicotine patch to help you quit smoking  Admission Diagnosis  Multifocal pneumonia [J18.9]   Discharge Diagnosis  Multifocal pneumonia [J18.9]    Principal Problem:   Influenza A with pneumonia Active Problems:   Acute metabolic encephalopathy   Cocaine-induced mood disorder (HCC)   Degenerative joint disease of both hips   Hypokalemia      Past Medical History:  Diagnosis Date   Chronic right shoulder pain    Complication of anesthesia    ' I have a hard time breathing after surgery"   Kidney stone    Kidney stone     Past Surgical History:  Procedure Laterality Date   ANKLE FRACTURE SURGERY Left    CYSTOSCOPY WITH STENT PLACEMENT Left 11/28/2013   Procedure: CYSTOSCOPY WITH STENT PLACEMENT;  Surgeon: Ky Barban, MD;  Location: [facility];  Service: Urology;  Laterality: Left;   EXTRACORPOREAL SHOCK WAVE LITHOTRIPSY Left 01/11/2014   Procedure: EXTRACORPOREAL SHOCK WAVE LITHOTRIPSY (ESWL) LEFT RENAL CALCULUS;  Surgeon: Ky Barban, MD;  Location: [facility];  Service: Urology;  Laterality: Left;   FRACTURE SURGERY     ORIF MANDIBULAR FRACTURE Bilateral 10/07/2013   Procedure: BILATERAL ORIF MANDIBULAR FRACTURE INTERMAXILLARY FIXATION/EXTRACTION OF TOOTH;  Surgeon: Glenna Fellows, MD;  Location: [facility];  Service: Plastics;  Laterality: Bilateral;  ANESTHESIA: NASO TRACHEAL INTUBATION   TUBAL LIGATION         HPI  from the history and physical done on the day of admission:  Patient coming from:  Home   I have personally briefly reviewed patient's old medical records in [facility]   Chief Complaint: Bilat hip pain   HPI: Elaine Morgan is a 53 y.o. female with medical history significant for cocaine abuse, kidney stones. Patient was brought to the ED via EMS, patient was found lying on the ground by the road.  My evaluation, patient is status post Ativan, intermittently arouses, and goes back to sleep unable to provide history.   On initial arrival to the ED, patient was at some point less responsive, then agitated, restless, writhing, complaining of bilateral hip pain.  She reported hip pain has been ongoing since September.   ED Course: Febrile to 101.6.  Heart rate 87-95.  Respiratory 16-20.  Blood pressure systolic 1 34-140.  O2 sats greater than 92% on room air. Flu positive Blood alcohol level less than 10.  Lactic acid 1.1. Chest x-ray shows multifocal lung opacities left greater than right. Pelvic x-ray shows severe degenerative changes of bilateral hip. Morphine 4 mg, and Ativan 1 mg given.  Tamiflu given.  IV ceftriaxone and azithromycin.   Review of  Systems: Unable to ascertain due to altered mental status     Hospital Course:    Assessment and Plan: 1)Influenza A with pneumonia with transient acute hypoxic respiratory failure--POA -On admission T-max 101.6,  --Chest xray with multifocal pneumonia.  -on admission CRP 4.5; procalcitonin <0.10; ESR 23 -Overall much improved after treatment with Rocephin/azithromycin, as well as Tamiflu bronchodilators and mucolytics -Patient has been successfully weaned off oxygen -Post ambulation O2 sats 96% on room air prior to discharge -Okay to discharge on Tamiflu, azithromycin, Omnicef and mucolytic/antitussives   2)Acute metabolic encephalopathy--polysubstance abuse related -Patient found on the streets lying on the floor.    -UDS with opiates, cocaine and THC -Mentation is back to baseline -Patient declines referral  for polysubstance abuse treatment/evaluation   3)Degenerative joint disease of both hips -Bilateral hip pain for about 5 to 6 months now as per patient reports.  Pelvic x-ray shows severe degenerative changes of bilateral hips, no acute displaced fracture or dislocation.   -Continue as needed OTC analgesics -Outpatient follow-up with orthopedic service recommended.   4)Cocaine-induced mood disorder (HCC) -Patient declines referral for drug rehab   5)Hypokalemia/hypomagnesemia -Replaced and normalized  6)Tobacco Abuse--smoking cessation strongly advised, okay to use over-the-counter nicotine patch    Discharge Condition: Stable without hypoxia  Follow UP--- social worker trying to help patient get PCP   Diet and Activity recommendation:  As advised  Discharge Instructions   Discharge Instructions     Call MD for:  difficulty breathing, headache or visual disturbances   Complete by: As directed    Call MD for:  persistant dizziness or light-headedness   Complete by: As directed    Call MD for:  persistant nausea and vomiting   Complete by: As directed    Call MD for:  temperature >100.4   Complete by: As directed    Diet - low sodium heart healthy   Complete by: As directed    Discharge instructions   Complete by: As directed    1)Please Take Medications as prescribed 2)Avoid Dehydration----Drink Plenty Fluids 3)Smoking cessation advised----- Use nicotine patch to help you quit smoking   Increase activity slowly   Complete by: As directed         Discharge Medications     Allergies as of 02/11/2024       Reactions   Hydrocodone Itching, Nausea And Vomiting   Hydrocodone-acetaminophen Itching        Medication List     STOP taking these medications    cephALEXin 500 MG capsule Commonly known as: KEFLEX   ibuprofen 200 MG tablet Commonly known as: ADVIL   ondansetron 4 MG tablet Commonly known as: ZOFRAN   oxyCODONE-acetaminophen 5-325 MG  tablet Commonly known as: PERCOCET/ROXICET   tamsulosin 0.4 MG Caps capsule Commonly known as: Flomax       TAKE these medications    acetaminophen 325 MG tablet Commonly known as: TYLENOL Take 2 tablets (650 mg total) by mouth every 6 (six) hours as needed for mild pain (pain score 1-3), headache or fever (or Fever >/= 101).   azithromycin 500 MG tablet Commonly known as: ZITHROMAX Take 1 tablet (500 mg total) by mouth daily for 3 days.   cefdinir 300 MG capsule Commonly known as: OMNICEF Take 1 capsule (300 mg total) by mouth 2 (two) times daily for 5 days.   guaiFENesin 600 MG 12 hr tablet Commonly known as: Mucinex Take 1 tablet (600 mg total) by mouth 2 (two) times daily.  oseltamivir 75 MG capsule Commonly known as: TAMIFLU Take 1 capsule (75 mg total) by mouth 2 (two) times daily for 5 days.       Major procedures and Radiology Reports - PLEASE review detailed and final reports for all details, in brief -   CT HEAD WO CONTRAST ( ) Result Date: 02/08/2024 CLINICAL DATA:  Altered mental status, found lying in street. EXAM: CT HEAD WITHOUT CONTRAST TECHNIQUE: Contiguous axial images were obtained from the base of the skull through the vertex without intravenous contrast. RADIATION DOSE REDUCTION: This exam was performed according to the departmental dose-optimization program which includes automated exposure control, adjustment of the mA and/or kV according to patient size and/or use of iterative reconstruction technique. COMPARISON:  MRI head 02/04/2019 FINDINGS: Brain: No acute intracranial hemorrhage. No CT evidence of acute infarct. Hypoattenuation in the left corona radiata may reflect chronic microvascular ischemic changes. No edema, mass effect, or midline shift. The basilar cisterns are patent. Ventricles: The ventricles are normal. Vascular: No hyperdense vessel or unexpected calcification. Skull: No acute or aggressive finding. Orbits: Orbits are symmetric.  Sinuses: Mild mucosal thickening in the ethmoid and sphenoid sinuses. Small air-fluid levels in the maxillary sinuses, right greater than left. Other: Mastoid air cells are clear. Rightward nasal septal deviation. IMPRESSION: 1. No acute intracranial abnormality. 2. Nonspecific hypoattenuation in the left corona radiata may reflect chronic microvascular ischemic changes. 3. Mild paranasal sinus disease with small air-fluid levels in the maxillary sinuses, right greater than left. Correlate for acute sinusitis. Electronically Signed   By: Emily Filbert M.D.   On: 02/08/2024 18:20   DG HIPS BILAT WITH PELVIS MIN 5 VIEWS Result Date: 02/08/2024 CLINICAL DATA:  Fever status post fall EXAM: DG HIP (WITH OR WITHOUT PELVIS) 5V BILAT COMPARISON:  CT abdomen and pelvis dated 11/26/2022 FINDINGS: There is no evidence of acute displaced hip fracture or dislocation. Severe degenerative changes of bilateral hips. IMPRESSION: 1. No acute displaced hip fracture or dislocation. 2. Severe degenerative changes of bilateral hips. Electronically Signed   By: Agustin Cree M.D.   On: 02/08/2024 15:06   DG Chest 1 View Result Date: 02/08/2024 CLINICAL DATA:  Fever EXAM: CHEST  1 VIEW COMPARISON:  Chest radiograph dated 05/29/2022 FINDINGS: Mildly low lung volumes. The inferior most right costophrenic angle is not entirely included within the field of view. Multifocal patchy lung space opacities, left-greater-than-right. No pleural effusion or pneumothorax. The heart size and mediastinal contours are within normal limits. No acute osseous abnormality. IMPRESSION: Multifocal patchy lung space opacities, left-greater-than-right, suspicious for multifocal pneumonia. Electronically Signed   By: Agustin Cree M.D.   On: 02/08/2024 15:04   Micro Results  Recent Results (from the past 240 hours)  Resp panel by RT-PCR (RSV, Flu A&B, Covid) Anterior Nasal Swab     Status: Abnormal   Collection Time: 02/08/24 12:15 PM   Specimen: Anterior Nasal  Swab  Result Value Ref Range Status   SARS Coronavirus 2 by RT PCR NEGATIVE NEGATIVE Final    Comment: (NOTE) SARS-CoV-2 target nucleic acids are NOT DETECTED.  The SARS-CoV-2 RNA is generally detectable in upper respiratory specimens during the acute phase of infection. The lowest concentration of SARS-CoV-2 viral copies this assay can detect is 138 copies/mL. A negative result does not preclude SARS-Cov-2 infection and should not be used as the sole basis for treatment or other patient management decisions. A negative result may occur with  improper specimen collection/handling, submission of specimen other than nasopharyngeal swab, presence of  viral mutation(s) within the areas targeted by this assay, and inadequate number of viral copies(<138 copies/mL). A negative result must be combined with clinical observations, patient history, and epidemiological information. The expected result is Negative.  Fact Sheet for Patients:  BloggerCourse.com  Fact Sheet for Healthcare Providers:  SeriousBroker.it  This test is no t yet approved or cleared by the [place] FDA and  has been authorized for detection and/or diagnosis of SARS-CoV-2 by FDA under an Emergency Use Authorization (EUA). This EUA will remain  in effect (meaning this test can be used) for the duration of the COVID-19 declaration under Section 564(b)(1) of the Act, 21 U.S.C.section 360bbb-3(b)(1), unless the authorization is terminated  or revoked sooner.       Influenza A by PCR POSITIVE (A) NEGATIVE Final   Influenza B by PCR NEGATIVE NEGATIVE Final    Comment: (NOTE) The Xpert Xpress SARS-CoV-2/FLU/RSV plus assay is intended as an aid in the diagnosis of influenza from Nasopharyngeal swab specimens and should not be used as a sole basis for treatment. Nasal washings and aspirates are unacceptable for Xpert Xpress SARS-CoV-2/FLU/RSV testing.  Fact Sheet for  Patients: BloggerCourse.com  Fact Sheet for Healthcare Providers: SeriousBroker.it  This test is not yet approved or cleared by the [place] FDA and has been authorized for detection and/or diagnosis of SARS-CoV-2 by FDA under an Emergency Use Authorization (EUA). This EUA will remain in effect (meaning this test can be used) for the duration of the COVID-19 declaration under Section 564(b)(1) of the Act, 21 U.S.C. section 360bbb-3(b)(1), unless the authorization is terminated or revoked.     Resp Syncytial Virus by PCR NEGATIVE NEGATIVE Final    Comment: (NOTE) Fact Sheet for Patients: BloggerCourse.com  Fact Sheet for Healthcare Providers: SeriousBroker.it  This test is not yet approved or cleared by the [place] FDA and has been authorized for detection and/or diagnosis of SARS-CoV-2 by FDA under an Emergency Use Authorization (EUA). This EUA will remain in effect (meaning this test can be used) for the duration of the COVID-19 declaration under Section 564(b)(1) of the Act, 21 U.S.C. section 360bbb-3(b)(1), unless the authorization is terminated or revoked.  Performed at [facility], [address] [place], [place] [address]   Culture, blood (routine x 2)     Status: None (Preliminary result)   Collection Time: 02/08/24 12:16 PM   Specimen: BLOOD  Result Value Ref Range Status   Specimen Description BLOOD BLOOD LEFT ARM  Final   Special Requests   Final    BOTTLES DRAWN AEROBIC AND ANAEROBIC Blood Culture results may not be optimal due to an inadequate volume of blood received in culture bottles   Culture   Final    NO GROWTH 3 DAYS Performed at [facility], [address], [place], [place] [address]    Report Status PENDING  Incomplete  Culture, blood (routine x 2)     Status: None (Preliminary result)   Collection Time: 02/08/24 12:16 PM   Specimen: BLOOD   Result Value Ref Range Status   Specimen Description BLOOD BLOOD LEFT HAND  Final   Special Requests   Final    Blood Culture results may not be optimal due to an inadequate volume of blood received in culture bottles BOTTLES DRAWN AEROBIC AND ANAEROBIC   Culture   Final    NO GROWTH 3 DAYS Performed at [facility], [address] [place], [place] [address]    Report Status PENDING  Incomplete  Urine Culture  Status: None   Collection Time: 02/10/24  1:00 AM   Specimen: Urine, Random  Result Value Ref Range Status   Specimen Description   Final    URINE, RANDOM Performed at [facility], [address], [place], [place] [address]    Special Requests   Final    NONE Performed at [facility], [address] [place], [place] [address]    Culture   Final    NO GROWTH Performed at [facility], [address] [place], [place] [address]    Report Status 02/11/2024 FINAL  Final    Today   Subjective    Elaine Morgan today has no new complaints -No further fevers, no chills --- Patient was reluctant to ambulate, after much persuasion patient ambulated and post ambulation O2 sats is 96% on room air        Patient has been seen and examined prior to discharge   Objective   Blood pressure (!) 171/91, pulse 61, temperature 97.7 F (36.5 C), resp. rate 18, height 5\' 6"  (1.676 m), weight 71.1 kg, last menstrual period 01/13/2017, SpO2 99%.   Intake/Output Summary (Last 24 hours) at 02/11/2024 1456 Last data filed at 02/11/2024 0900 Gross per 24 hour  Intake 470 ml  Output --  Net 470 ml    Exam Gen:- Awake Alert, no acute distress  HEENT:- Advance.AT, No sclera icterus Neck-Supple Neck,No JVD,.  Lungs-much improved air movement, no wheezing  CV- S1, S2 normal, regular Abd-  +ve B.Sounds, Abd Soft, No tenderness,    Extremity/Skin:- No  edema,   good pulses Psych-affect is appropriate, oriented x3 Neuro-no new focal deficits, no tremors    Data Review   CBC w  Diff:  Lab Results  Component Value Date   WBC 6.5 02/10/2024   HGB 10.8 (L) 02/10/2024   HCT 31.7 (L) 02/10/2024   PLT 87 (L) 02/10/2024   LYMPHOPCT 11 02/08/2024   BANDSPCT 1 11/27/2013   MONOPCT 12 02/08/2024   EOSPCT 0 02/08/2024   BASOPCT 0 02/08/2024    CMP:  Lab Results  Component Value Date   NA 137 02/10/2024   K 3.6 02/10/2024   CL 111 02/10/2024   CO2 22 02/10/2024   BUN 14 02/10/2024   CREATININE 0.68 02/10/2024   PROT 7.0 02/08/2024   ALBUMIN 3.4 (L) 02/08/2024   BILITOT 0.4 02/08/2024   ALKPHOS 61 02/08/2024   AST 41 02/08/2024   ALT 31 02/08/2024  .  Total Discharge time is about 33 minutes  Shon Hale M.D on 02/11/2024 at 2:56 PM  Go to [email or web address] -  for contact info  [facility] - Office  [phone]

## 2024-02-11 NOTE — TOC Progression Note (Signed)
 Transition of Care [facility]) - Progression Note    Patient Details  Name: Elaine Morgan MRN: [id number] Date of Birth: May 30, 1971  [facility]) CM/SW Contact  Isabella Bowens, [place] Phone Number: 02/11/2024, 1:41 PM  Clinical Narrative:     CSW spoke with patient at bedside and provided her with [place] probation office number and homeless shelter list. Patient expressed that she was not calling no shelter and that she did not care what the hospital does, "they can kick me out all I care". CSW tried to assist, patient became frustrated. TOC signing off.     Barriers to Discharge: Barriers Resolved  Expected Discharge Plan and Services  Patient is homeless , shelter list was provided.      Expected Discharge Date: 02/11/24                    Social Determinants of Health (SDOH) Interventions SDOH Screenings   Food Insecurity: Patient Unable To Answer (02/08/2024)  Housing: Patient Unable To Answer (02/08/2024)  Transportation Needs: Patient Unable To Answer (02/08/2024)  Utilities: Patient Unable To Answer (02/08/2024)  Social Connections: Patient Unable To Answer (02/08/2024)  Tobacco Use: High Risk (02/08/2024)    Readmission Risk Interventions    02/11/2024    1:40 PM  Readmission Risk Prevention Plan  Medication Screening Complete  Transportation Screening Complete

## 2024-02-13 LAB — CULTURE, BLOOD (ROUTINE X 2)
Culture: NO GROWTH
Culture: NO GROWTH

## 2024-02-24 ENCOUNTER — Telehealth: Payer: Self-pay

## 2024-02-24 NOTE — Telephone Encounter (Signed)
 Received referral from [place] at [facility] on 3/4 to reach out to the patient about the [facility] Connect program and help the patient receive a PCP. I reached out to the patient this afternoon, No answer, was able to leave a voicemail. [employer] shows that the patient is ineligible for Medicaid. We can also provide assistance to the patient to apply for Medicaid

## 2024-03-31 ENCOUNTER — Other Ambulatory Visit: Payer: Self-pay

## 2024-03-31 ENCOUNTER — Emergency Department (HOSPITAL_COMMUNITY)
Admission: EM | Admit: 2024-03-31 | Discharge: 2024-03-31 | Discharge status: Against Medical Advice | Payer: Self-pay | Attending: Emergency Medicine | Admitting: Emergency Medicine

## 2024-03-31 ENCOUNTER — Emergency Department (HOSPITAL_COMMUNITY): Payer: Self-pay

## 2024-03-31 ENCOUNTER — Encounter (HOSPITAL_COMMUNITY): Payer: Self-pay

## 2024-03-31 DIAGNOSIS — M25551 Pain in right hip: Secondary | ICD-10-CM | POA: Insufficient documentation

## 2024-03-31 DIAGNOSIS — Z5321 Procedure and treatment not carried out due to patient leaving prior to being seen by health care provider: Secondary | ICD-10-CM | POA: Insufficient documentation

## 2024-03-31 DIAGNOSIS — W109XXA Fall (on) (from) unspecified stairs and steps, initial encounter: Secondary | ICD-10-CM | POA: Insufficient documentation

## 2024-03-31 NOTE — ED Triage Notes (Signed)
 Pt is c/o right hip. Pt states she fell down some steps (3 steps) back in August. Pt states her hip has gotten worse since the fall. Pt is using a cane to walk.
